# Patient Record
Sex: Female | Born: 1995 | Race: Asian | Hispanic: No | Marital: Single | Smoking: Never smoker
Health system: Southern US, Community
[De-identification: ages and names within clinical notes are randomized; demographics above are authoritative.]

## PROBLEM LIST (undated history)

## (undated) DIAGNOSIS — L709 Acne, unspecified: Secondary | ICD-10-CM

## (undated) HISTORY — PX: WISDOM TOOTH EXTRACTION: SHX21

## (undated) HISTORY — DX: Acne, unspecified: L70.9

---
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
## 2016-02-05 ENCOUNTER — Telehealth: Payer: Self-pay | Admitting: Family Medicine

## 2016-02-05 NOTE — Telephone Encounter (Signed)
We received faxed from [facility] regarding pt having back pain. Called pt on Monday to see in appt was needed, father answered stating that [facility] and in now back in [place] for school.

## 2016-02-29 ENCOUNTER — Other Ambulatory Visit: Payer: Self-pay | Admitting: Family Medicine

## 2016-04-04 ENCOUNTER — Encounter: Admitting: Family Medicine

## 2016-04-23 ENCOUNTER — Ambulatory Visit (INDEPENDENT_AMBULATORY_CARE_PROVIDER_SITE_OTHER): Payer: No Typology Code available for payment source | Admitting: Family Medicine

## 2016-04-23 ENCOUNTER — Encounter: Payer: Self-pay | Admitting: Family Medicine

## 2016-04-23 DIAGNOSIS — Z0000 Encounter for general adult medical examination without abnormal findings: Secondary | ICD-10-CM | POA: Diagnosis not present

## 2016-04-23 NOTE — Assessment & Plan Note (Signed)
Pt's PE WNL.  UTD on immunizations.  Too young for pap.  Anticipatory guidance provided.

## 2016-04-23 NOTE — Patient Instructions (Signed)
Follow up in 1 year or as needed Keep up the good work on healthy diet and regular exercise- you look great!!! I'm so proud of your hard work at school- keep it up!!! Call with any questions or concerns Happy Holidays!!!

## 2016-04-23 NOTE — Progress Notes (Signed)
   Subjective:    Patient ID: Carolyn Cummings, female    DOB: [birth year], 20 y.o.   MRN: [id number]  HPI CPE- UTD on all immunizations.  Too young for pap.  [place] [employer] studying exercise science.  No concerns today.  Not currently sexually active.  Currently has a boyfriend but he is in [place] (in [place]  Denies depression/anxiety.  Not currently drinking, smoking, drugs.   Review of Systems Patient reports no vision/ hearing changes, adenopathy,fever, weight change,  persistant/recurrent hoarseness , swallowing issues, chest pain, palpitations, edema, persistant/recurrent cough, hemoptysis, dyspnea (rest/exertional/paroxysmal nocturnal), gastrointestinal bleeding (melena, rectal bleeding), abdominal pain, significant heartburn, bowel changes, GU symptoms (dysuria, hematuria, incontinence), Gyn symptoms (abnormal  bleeding, pain),  syncope, focal weakness, memory loss, numbness & tingling, skin/hair/nail changes, abnormal bruising or bleeding, anxiety, or depression.     Objective:   Physical Exam  General Appearance:    Alert, cooperative, no distress, appears stated age  Head:    Normocephalic, without obvious abnormality, atraumatic  Eyes:    PERRL, conjunctiva/corneas clear, EOM's intact, fundi    benign, both eyes  Ears:    Normal TM's and external ear canals, both ears  Nose:   Nares normal, septum midline, mucosa normal, no drainage    or sinus tenderness  Throat:   Lips, mucosa, and tongue normal; teeth and gums normal  Neck:   Supple, symmetrical, trachea midline, no adenopathy;    Thyroid: no enlargement/tenderness/nodules  Back:     Symmetric, no curvature, ROM normal, no CVA tenderness  Lungs:     Clear to auscultation bilaterally, respirations unlabored  Chest Wall:    No tenderness or deformity   Heart:    Regular rate and rhythm, S1 and S2 normal, no murmur, rub   or gallop  Breast Exam:    No tenderness, masses, or nipple abnormality  Abdomen:     Soft,  non-tender, bowel sounds active all four quadrants,    no masses, no organomegaly  Genitalia:    deferred  Rectal:    Extremities:   Extremities normal, atraumatic, no cyanosis or edema  Pulses:   2+ and symmetric all extremities  Skin:   Skin color, texture, turgor normal, no rashes or lesions  Lymph nodes:   Cervical, supraclavicular, and axillary nodes normal  Neurologic:   CNII-XII intact, normal strength, sensation and reflexes    throughout          Assessment & Plan:

## 2016-04-23 NOTE — Progress Notes (Signed)
Pre visit review using our clinic review tool, if applicable. No additional management support is needed unless otherwise documented below in the visit note. 

## 2016-05-07 ENCOUNTER — Encounter: Payer: Self-pay | Admitting: Family Medicine

## 2016-05-08 MED ORDER — NORGESTIMATE-ETH ESTRADIOL 0.25-35 MG-MCG PO TABS: 1.0000 | ORAL_TABLET | Freq: Every day | ORAL | 1 refills | Status: DC

## 2016-09-14 ENCOUNTER — Other Ambulatory Visit: Payer: Self-pay | Admitting: Family Medicine

## 2016-10-11 ENCOUNTER — Other Ambulatory Visit (HOSPITAL_COMMUNITY)
Admission: RE | Admit: 2016-10-11 | Discharge: 2016-10-11 | Disposition: A | Discharge status: Home / Self Care | Source: Ambulatory Visit | Attending: Family Medicine | Admitting: Family Medicine

## 2016-10-11 ENCOUNTER — Ambulatory Visit (INDEPENDENT_AMBULATORY_CARE_PROVIDER_SITE_OTHER): Admitting: Family Medicine

## 2016-10-11 ENCOUNTER — Encounter: Payer: Self-pay | Admitting: Family Medicine

## 2016-10-11 VITALS — BP 102/68 | HR 76 | Temp 98.0°F | Resp 16 | Ht 60.0 in | Wt 119.4 lb

## 2016-10-11 DIAGNOSIS — Z202 Contact with and (suspected) exposure to infections with a predominantly sexual mode of transmission: Secondary | ICD-10-CM | POA: Insufficient documentation

## 2016-10-11 NOTE — Progress Notes (Signed)
   Subjective:    Patient ID: Carolyn NovasJilien Cummings, female    DOB: [birth year], 21 y.o.   MRN: [id number]  HPI STD testing- currently sexually active w/ 1 partner but has been active w/ others in the past.  Using condoms each time.  No sxs- no vaginal d/c, pain, sores, lesions.   Review of Systems For ROS see HPI     Objective:   Physical Exam  Constitutional: She is oriented to person, place, and time. She appears well-developed and well-nourished. No distress.  HENT:  Head: Normocephalic and atraumatic.  Genitourinary:  Genitourinary Comments: Deferred at pt's request  Neurological: She is alert and oriented to person, place, and time.  Skin: Skin is warm and dry.  Psychiatric: She has a normal mood and affect. Her behavior is normal. Thought content normal.  Vitals reviewed.         Assessment & Plan:  Possible STD exposure- new.  Pt reports she has been sexually active w/ multiple partners in the past.  Reports she is using condoms regularly but 'wants to be sure'.  Will check labs to determine if any tx is needed.  Pt expressed understanding and is in agreement w/ plan.

## 2016-10-11 NOTE — Progress Notes (Signed)
Pre visit review using our clinic review tool, if applicable. No additional management support is needed unless otherwise documented below in the visit note. 

## 2016-10-11 NOTE — Patient Instructions (Signed)
Follow up as needed/scheduled We'll notify you of your lab results and make any changes if needed Continue to protect yourself every time! I'm proud of you for being responsible! Have a great summer!!

## 2016-10-12 LAB — HIV ANTIBODY (ROUTINE TESTING W REFLEX): HIV 1&2 Ab, 4th Generation: NONREACTIVE

## 2016-10-12 LAB — RPR

## 2016-10-14 LAB — URINE CYTOLOGY ANCILLARY ONLY
Chlamydia: NEGATIVE
Neisseria Gonorrhea: NEGATIVE
Trichomonas: NEGATIVE

## 2016-10-15 LAB — HSV(HERPES SMPLX)ABS-I+II(IGG+IGM)-BLD
HSV 1 Glycoprotein G Ab, IgG: 53.7 index — ABNORMAL HIGH (ref 0.00–0.90)
HSV 2 Glycoprotein G Ab, IgG: <0.91 index (ref 0.00–0.90)
HSVI/II Comb IgM: <0.91 Ratio (ref 0.00–0.90)

## 2017-02-10 ENCOUNTER — Ambulatory Visit (INDEPENDENT_AMBULATORY_CARE_PROVIDER_SITE_OTHER): Admitting: Physician Assistant

## 2017-02-10 ENCOUNTER — Encounter: Payer: Self-pay | Admitting: Physician Assistant

## 2017-02-10 VITALS — BP 120/80 | HR 75 | Temp 98.8°F | Ht 60.0 in | Wt 114.0 lb

## 2017-02-10 DIAGNOSIS — N3090 Cystitis, unspecified without hematuria: Secondary | ICD-10-CM | POA: Diagnosis not present

## 2017-02-10 LAB — POCT URINALYSIS DIPSTICK
Bilirubin, UA: NEGATIVE
Blood, UA: 3
Glucose, UA: NEGATIVE
Ketones, UA: 1
Nitrite, UA: NEGATIVE
Protein, UA: 2
Spec Grav, UA: >=1.03 — AB (ref 1.010–1.025)
Urobilinogen, UA: 0.2 E.U./dL
pH, UA: 6 (ref 5.0–8.0)

## 2017-02-10 MED ORDER — NITROFURANTOIN MONOHYD MACRO 100 MG PO CAPS: 100.0000 mg | ORAL_CAPSULE | Freq: Two times a day (BID) | ORAL | 0 refills | Status: DC

## 2017-02-10 NOTE — Progress Notes (Signed)
Carolyn Cummings is a 21 y.o. female here for a new problem.  I acted as a Neurosurgeon for [employer], PA-C Corky Mull, LPN  History of Present Illness:   Chief Complaint  Patient presents with  . Dysuria  . Urinary Frequency    Dysuria   Episode onset: started 2 days ago. The problem occurs every urination. The problem has been gradually worsening. The quality of the pain is described as burning. The pain is at a severity of 8/10. The pain is moderate. She is sexually active. There is no history of pyelonephritis. She has tried increased fluids (Monistat) for the symptoms. The treatment provided no relief.   She declines STI testing, as she just had this performed in June and was negative. She denies: back pain, fever, n/v, prior hx of UTI/pyelonephritis.     Past Medical History:  Diagnosis Date  . Acne      Social History   Social History  . Marital status: Single    Spouse name: N/A  . Number of children: N/A  . Years of education: N/A   Occupational History  . Not on file.   Social History Main Topics  . Smoking status: Never Smoker  . Smokeless tobacco: Never Used  . Alcohol use No  . Drug use: No  . Sexual activity: No   Other Topics Concern  . Not on file   Social History Narrative  . No narrative on file    Past Surgical History:  Procedure Laterality Date  . WISDOM TOOTH EXTRACTION      Family History  Problem Relation Age of Onset  . Adopted: Yes    No Known Allergies  Current Medications:   Current Outpatient Prescriptions:  .  EPIDUO FORTE 0.3-2.5 % GEL, , Disp: , Rfl: 0 .  MONONESSA 0.25-35 MG-MCG tablet, TAKE 1 TABLET DAILY, Disp: 84 tablet, Rfl: 1 .  nitrofurantoin, macrocrystal-monohydrate, (MACROBID) 100 MG capsule, Take 1 capsule (100 mg total) by mouth 2 (two) times daily., Disp: 10 capsule, Rfl: 0   Review of Systems:   Review of Systems  Genitourinary: Positive for dysuria.  All other systems reviewed and are  negative.   Vitals:   Vitals:   02/10/17 1012  BP: 120/80  Pulse: 75  Temp: 98.8 F (37.1 C)  TempSrc: Oral  SpO2: 99%  Weight: 114 lb (51.7 kg)  Height: 5' (1.524 m)     Body mass index is 22.26 kg/m.  Physical Exam:   Physical Exam  Constitutional: She appears well-developed. She is cooperative.  Non-toxic appearance. She does not have a sickly appearance. She does not appear ill. No distress.  Cardiovascular: Normal rate, regular rhythm, S1 normal, S2 normal, normal heart sounds and normal pulses.   No LE edema  Pulmonary/Chest: Effort normal and breath sounds normal.  Abdominal: Normal appearance and bowel sounds are normal. There is no tenderness. There is no rigidity, no rebound, no guarding and no CVA tenderness.  Neurological: She is alert. GCS eye subscore is 4. GCS verbal subscore is 5. GCS motor subscore is 6.  Skin: Skin is warm, dry and intact.  Psychiatric: She has a normal mood and affect. Her speech is normal and behavior is normal.  Nursing note and vitals reviewed.  Results for orders placed or performed in visit on 02/10/17  POCT urinalysis dipstick  Result Value Ref Range   Color, UA amber    Clarity, UA cloudy    Glucose, UA Negative    Bilirubin,  UA Negative    Ketones, UA 1    Spec Grav, UA >=1.030 (A) 1.010 - 1.025   Blood, UA 3    pH, UA 6.0 5.0 - 8.0   Protein, UA 2    Urobilinogen, UA 0.2 0.2 or 1.0 E.U./dL   Nitrite, UA Negative    Leukocytes, UA Moderate (2+) (A) Negative    Assessment and Plan:    Carolyn Cummings was seen today for dysuria and urinary frequency.  Diagnoses and all orders for this visit:  Cystitis Uncomplicated. No systemic symptoms present. Treat with macrobid per orders. Discussed need for adequate water intake, push fluids until urine is clear and maintain this. We discussed use of AZO, and if she decides to take it to only take for a short duration, <2 days. I did order a urine culture. Follow-up if symptoms worsen or  persist. -     POCT urinalysis dipstick -     Urine Culture  Other orders -     nitrofurantoin, macrocrystal-monohydrate, (MACROBID) 100 MG capsule; Take 1 capsule (100 mg total) by mouth 2 (two) times daily.    . Reviewed expectations re: course of current medical issues. . Discussed self-management of symptoms. . Outlined signs and symptoms indicating need for more acute intervention. . Patient verbalized understanding and all questions were answered. . See orders for this visit as documented in the electronic medical record. . Patient received an After-Visit Summary.  CMA or LPN served as scribe during this visit. History, Physical, and Plan performed by medical provider. Documentation and orders reviewed and attested to.  Jarold Motto, PA-C

## 2017-02-10 NOTE — Patient Instructions (Signed)
It was great to meet you!   Start the antibiotic. Take with plenty of fluid. Maintain adequate fluid intake to keep urine mostly clear.  If you decide to take AZO, do not take for more than 3 days.

## 2017-02-13 LAB — URINE CULTURE
MICRO NUMBER:: 81117456
SPECIMEN QUALITY:: ADEQUATE

## 2017-03-03 ENCOUNTER — Other Ambulatory Visit: Payer: Self-pay | Admitting: Family Medicine

## 2017-04-24 ENCOUNTER — Encounter: Admitting: Family Medicine

## 2017-05-07 ENCOUNTER — Encounter: Payer: Self-pay | Admitting: Family Medicine

## 2017-05-07 ENCOUNTER — Ambulatory Visit (INDEPENDENT_AMBULATORY_CARE_PROVIDER_SITE_OTHER): Admitting: Family Medicine

## 2017-05-07 VITALS — BP 120/68 | HR 80 | Temp 98.9°F | Ht 59.5 in | Wt 110.4 lb

## 2017-05-07 DIAGNOSIS — Z3041 Encounter for surveillance of contraceptive pills: Secondary | ICD-10-CM | POA: Insufficient documentation

## 2017-05-07 DIAGNOSIS — L700 Acne vulgaris: Secondary | ICD-10-CM | POA: Insufficient documentation

## 2017-05-07 DIAGNOSIS — Z0000 Encounter for general adult medical examination without abnormal findings: Secondary | ICD-10-CM | POA: Diagnosis not present

## 2017-05-07 LAB — COMPREHENSIVE METABOLIC PANEL
ALT: 23 U/L (ref 0–35)
AST: 22 U/L (ref 0–37)
Albumin: 4.2 g/dL (ref 3.5–5.2)
Alkaline Phosphatase: 68 U/L (ref 39–117)
BUN: 12 mg/dL (ref 6–23)
CO2: 28 mEq/L (ref 19–32)
Calcium: 9.1 mg/dL (ref 8.4–10.5)
Chloride: 102 mEq/L (ref 96–112)
Creatinine, Ser: 0.88 mg/dL (ref 0.40–1.20)
GFR: 86.89 mL/min (ref >60.00)
Glucose, Bld: 150 mg/dL — ABNORMAL HIGH (ref 70–99)
Potassium: 3.7 mEq/L (ref 3.5–5.1)
Sodium: 139 mEq/L (ref 135–145)
Total Bilirubin: 0.5 mg/dL (ref 0.2–1.2)
Total Protein: 6.6 g/dL (ref 6.0–8.3)

## 2017-05-07 LAB — CBC WITH DIFFERENTIAL/PLATELET
Basophils Absolute: 0 10*3/uL (ref 0.0–0.1)
Basophils Relative: 0.7 % (ref 0.0–3.0)
Eosinophils Absolute: 0.1 10*3/uL (ref 0.0–0.7)
Eosinophils Relative: 1.5 % (ref 0.0–5.0)
HCT: 42.5 % (ref 36.0–46.0)
Hemoglobin: 13.7 g/dL (ref 12.0–15.0)
Lymphocytes Relative: 39.5 % (ref 12.0–46.0)
Lymphs Abs: 2.1 10*3/uL (ref 0.7–4.0)
MCHC: 32.3 g/dL (ref 30.0–36.0)
MCV: 87.8 fl (ref 78.0–100.0)
Monocytes Absolute: 0.4 10*3/uL (ref 0.1–1.0)
Monocytes Relative: 7.1 % (ref 3.0–12.0)
Neutro Abs: 2.8 10*3/uL (ref 1.4–7.7)
Neutrophils Relative %: 51.2 % (ref 43.0–77.0)
Platelets: 276 10*3/uL (ref 150.0–400.0)
RBC: 4.84 Mil/uL (ref 3.87–5.11)
RDW: 13.3 % (ref 11.5–14.6)
WBC: 5.4 10*3/uL (ref 4.5–10.5)

## 2017-05-07 LAB — LIPID PANEL
Cholesterol: 167 mg/dL (ref 0–200)
HDL: 71.2 mg/dL (ref >39.00)
LDL Cholesterol: 58 mg/dL (ref 0–99)
NonHDL: 96.05
Total CHOL/HDL Ratio: 2
Triglycerides: 188 mg/dL — ABNORMAL HIGH (ref 0.0–149.0)
VLDL: 37.6 mg/dL (ref 0.0–40.0)

## 2017-05-07 NOTE — Patient Instructions (Signed)
Please return in 1 year for your physical.   Please do these things to maintain good health!   Exercise at least 30-45 minutes a day,  4-5 days a week.   Eat a low-fat diet with lots of fruits and vegetables, up to 7-9 servings per day.  Drink plenty of water daily. Try to drink 8 8oz glasses per day.  Seatbelts can save your life. Always wear your seatbelt.  Place Smoke Detectors on every level of your home and check batteries every year.  Schedule an appointment with an eye doctor for an eye exam every 1-2 years  Safe sex - use condoms to protect yourself from STDs if you could be exposed to these types of infections. Use birth control if you do not want to become pregnant and are sexually active.  Avoid heavy alcohol use. If you drink, keep it to less than 2 drinks/day and not every day.  Health Care Power of Attorney.  Choose someone you trust that could speak for you if you became unable to speak for yourself.  Depression is common in our stressful world.If you're feeling down or losing interest in things you normally enjoy, please come in for a visit.  If anyone is threatening or hurting you, please get help. Physical or Emotional Violence is never OK.

## 2017-05-07 NOTE — Progress Notes (Signed)
Subjective  Chief Complaint  Patient presents with  . Annual Exam    Not fasting    HPI: Carolyn Cummings is a 22 y.o. female who presents to [place] at [facility] today for a Female Wellness Visit.   Wellness Visit: annual visit with health maintenance review and exam without Pap   Healthy 22 yo G0 for annual exam. 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 Doing well. Not currently in a relationship nor sexually active now; has been in past; on OCPs w/o AEs. Last STD testing 6 months ago and negative. No h/o STDs. No concerns.   Healthy lifestyle Lifestyle: Body mass index is 21.92 kg/m. Wt Readings from Last 3 Encounters:  05/07/17 110 lb 6.1 oz (50.1 kg)  02/10/17 114 lb (51.7 kg) (22 %, Z= -0.78)*  10/11/16 119 lb 6 oz (54.1 kg) (33 %, Z= -0.44)*   * Growth percentiles are based on CDC (Girls, 2-20 Years) data.   Diet: low fat Exercise: daily, aerobics, running/ jogging and weightlifting Need for contraception: Yes, OCP (estrogen/progesterone)  Patient Active Problem List   Diagnosis Date Noted  . Acne vulgaris 05/07/2017  . Oral contraceptive use 05/07/2017  . Physical exam 04/23/2016   Health Maintenance  Topic Date Due  . INFLUENZA VACCINE  02/10/2018 (Originally 12/04/2016)  . TETANUS/TDAP  06/02/2018  . HIV Screening  Completed   Immunization History  Administered Date(s) Administered  . DTaP 12/22/1997, 02/02/1998, 03/09/1998, 09/04/1998, 07/30/2002  . H1N1 06/02/2008  . HPV Quadrivalent 10/17/2008, 02/17/2009, 02/28/2010  . Hepatitis A 08/13/2005, 03/11/2007  . Hepatitis B 02/02/1998, 03/23/1998, 09/04/1998  . HiB (PRP-OMP) 12/22/1997, 03/09/1998, 06/22/1998  . IPV 12/22/1997, 02/02/1998, 09/04/1998, 07/30/2002  . Influenza Nasal 03/11/2007, 06/02/2008, 02/28/2010  . Influenza Split 03/19/2011, 06/08/2013  . Influenza,inj,Quad PF,6+ Mos 12/23/2015  . MMR 03/09/1998, 07/30/2002  . Meningococcal Conjugate 06/02/2008, 04/04/2015    . Pneumococcal Conjugate-13 12/22/1997, 09/04/1998  . Tdap 06/02/2008  . Varicella 03/09/1998, 10/16/1998   We updated and reviewed the patient's past history in detail and it is documented below. Allergies: Patient has No Known Allergies. Past Medical History Patient  has a past medical history of Acne. Past Surgical History Patient  has a past surgical history that includes Wisdom tooth extraction. Family History: Patient family history is not on file. She was adopted. Social History:  Patient  reports that  has never smoked. she has never used smokeless tobacco. She reports that she does not drink alcohol or use drugs.  Review of Systems: Constitutional: negative for fever or malaise Ophthalmic: negative for photophobia, double vision or loss of vision Cardiovascular: negative for chest pain, dyspnea on exertion, or new LE swelling Respiratory: negative for SOB or persistent cough Gastrointestinal: negative for abdominal pain, change in bowel habits or melena Genitourinary: negative for dysuria or gross hematuria, no abnormal uterine bleeding or disharge Musculoskeletal: negative for new gait disturbance or muscular weakness Integumentary: negative for new or persistent rashes, no breast lumps Neurological: negative for TIA or stroke symptoms Psychiatric: negative for SI or delusions Allergic/Immunologic: negative for hives Patient Care Team    Relationship Specialty Notifications Start End  Midge Minium, MD PCP - General Family Medicine  04/04/15     Objective  Vitals: BP 120/68 (BP Location: Left Arm, Patient Position: Sitting, Cuff Size: Normal)   Pulse 80   Temp 98.9 F (37.2 C) (Oral)   Ht 4' 11.5" (1.511 m)   Wt 110 lb 6.1 oz (50.1 kg)   LMP 04/21/2017  SpO2 98%   BMI 21.92 kg/m  General:  Well developed, well nourished, no acute distress  Psych:  Alert and orientedx3,normal mood and affect HEENT:  Normocephalic, atraumatic, non-icteric sclera, PERRL,  oropharynx is clear without mass or exudate, supple neck without adenopathy, mass or thyromegaly Cardiovascular:  Normal S1, S2, RRR without gallop, rub or murmur, nondisplaced PMI Respiratory:  Good breath sounds bilaterally, CTAB with normal respiratory effort Gastrointestinal: normal bowel sounds, soft, non-tender, no noted masses. No HSM MSK: no deformities, contusions. Joints are without erythema or swelling. Spine and CVA region are nontender Skin:  Warm, no rashes or suspicious lesions noted Neurologic:    Mental status is normal. CN 2-11 are normal. Gross motor and sensory exams are normal. Normal gait. No tremor   Assessment  1. Physical exam   2. Oral contraceptive use      Plan  Female Wellness Visit:  Age appropriate Health Maintenance and Prevention measures were discussed with patient. Included topics are cancer screening recommendations, ways to keep healthy (see AVS) including dietary and exercise recommendations, regular eye and dental care, use of seat belts, and avoidance of moderate alcohol use and tobacco use.   BMI: discussed patient's BMI and encouraged positive lifestyle modifications to help get to or maintain a target BMI.  HM needs and immunizations were addressed and ordered. See below for orders. See HM and immunization section for updates. Pt declines flu shot; education given.  Routine labs and screening tests ordered including cmp, cbc and lipids where appropriate.  Discussed recommendations regarding Vit D and calcium supplementation (see AVS)  Continue ocps  Follow up: Return in about 12 months (around 05/07/2018) for your complete physical.    Commons side effects, risks, benefits, and alternatives for medications and treatment plan prescribed today were discussed, and the patient expressed understanding of the given instructions. Patient is instructed to call or message via MyChart if he/she has any questions or concerns regarding our treatment plan.  No barriers to understanding were identified. We discussed Red Flag symptoms and signs in detail. Patient expressed understanding regarding what to do in case of urgent or emergency type symptoms.   Medication list was reconciled, printed and provided to the patient in AVS. Patient instructions and summary information was reviewed with the patient as documented in the AVS. This note was prepared with assistance of Dragon voice recognition software. Occasional wrong-word or sound-a-like substitutions may have occurred due to the inherent limitations of voice recognition software  Orders Placed This Encounter  Procedures  . CBC with Differential/Platelet  . Comprehensive metabolic panel  . Lipid panel   No orders of the defined types were placed in this encounter.

## 2017-08-18 ENCOUNTER — Other Ambulatory Visit: Payer: Self-pay | Admitting: Family Medicine

## 2017-10-21 ENCOUNTER — Ambulatory Visit (INDEPENDENT_AMBULATORY_CARE_PROVIDER_SITE_OTHER): Admitting: Family Medicine

## 2017-10-21 ENCOUNTER — Other Ambulatory Visit: Payer: Self-pay

## 2017-10-21 ENCOUNTER — Encounter: Payer: Self-pay | Admitting: Family Medicine

## 2017-10-21 VITALS — BP 124/76 | HR 92 | Temp 98.5°F | Resp 16 | Ht 59.5 in | Wt 115.0 lb

## 2017-10-21 DIAGNOSIS — M545 Low back pain, unspecified: Secondary | ICD-10-CM

## 2017-10-21 DIAGNOSIS — H1011 Acute atopic conjunctivitis, right eye: Secondary | ICD-10-CM

## 2017-10-21 MED ORDER — DICLOFENAC SODIUM 75 MG PO TBEC: 75.0000 mg | DELAYED_RELEASE_TABLET | Freq: Two times a day (BID) | ORAL | 0 refills | Status: DC

## 2017-10-21 NOTE — Patient Instructions (Signed)
Please follow up if symptoms do not improve or as needed.  Take the antiinflammatory pain medications twice a day with food for the next 1-2 weeks.   Return for reevaluation in 2-3 weeks if your back pain is not better.  If your eye becomes itchy or red again, use OTC Zadator eye drops.    Back Exercises The following exercises strengthen the muscles that help to support the back. They also help to keep the lower back flexible. Doing these exercises can help to prevent back pain or lessen existing pain. If you have back pain or discomfort, try doing these exercises 2-3 times each day or as told by your health care provider. When the pain goes away, do them once each day, but increase the number of times that you repeat the steps for each exercise (do more repetitions). If you do not have back pain or discomfort, do these exercises once each day or as told by your health care provider. Exercises Single Knee to Chest  Repeat these steps 3-5 times for each leg: 1. Lie on your back on a firm bed or the floor with your legs extended. 2. Bring one knee to your chest. Your other leg should stay extended and in contact with the floor. 3. Hold your knee in place by grabbing your knee or thigh. 4. Pull on your knee until you feel a gentle stretch in your lower back. 5. Hold the stretch for 10-30 seconds. 6. Slowly release and straighten your leg.  Pelvic Tilt  Repeat these steps 5-10 times: 1. Lie on your back on a firm bed or the floor with your legs extended. 2. Bend your knees so they are pointing toward the ceiling and your feet are flat on the floor. 3. Tighten your lower abdominal muscles to press your lower back against the floor. This motion will tilt your pelvis so your tailbone points up toward the ceiling instead of pointing to your feet or the floor. 4. With gentle tension and even breathing, hold this position for 5-10 seconds.  Cat-Cow  Repeat these steps until your lower back  becomes more flexible: 1. Get into a hands-and-knees position on a firm surface. Keep your hands under your shoulders, and keep your knees under your hips. You may place padding under your knees for comfort. 2. Let your head hang down, and point your tailbone toward the floor so your lower back becomes rounded like the back of a cat. 3. Hold this position for 5 seconds. 4. Slowly lift your head and point your tailbone up toward the ceiling so your back forms a sagging arch like the back of a cow. 5. Hold this position for 5 seconds.  Press-Ups  Repeat these steps 5-10 times: 1. Lie on your abdomen (face-down) on the floor. 2. Place your palms near your head, about shoulder-width apart. 3. While you keep your back as relaxed as possible and keep your hips on the floor, slowly straighten your arms to raise the top half of your body and lift your shoulders. Do not use your back muscles to raise your upper torso. You may adjust the placement of your hands to make yourself more comfortable. 4. Hold this position for 5 seconds while you keep your back relaxed. 5. Slowly return to lying flat on the floor.  Bridges  Repeat these steps 10 times: 1. Lie on your back on a firm surface. 2. Bend your knees so they are pointing toward the ceiling and your feet  are flat on the floor. 3. Tighten your buttocks muscles and lift your buttocks off of the floor until your waist is at almost the same height as your knees. You should feel the muscles working in your buttocks and the back of your thighs. If you do not feel these muscles, slide your feet 1-2 inches farther away from your buttocks. 4. Hold this position for 3-5 seconds. 5. Slowly lower your hips to the starting position, and allow your buttocks muscles to relax completely.  If this exercise is too easy, try doing it with your arms crossed over your chest. Abdominal Crunches  Repeat these steps 5-10 times: 1. Lie on your back on a firm bed or the  floor with your legs extended. 2. Bend your knees so they are pointing toward the ceiling and your feet are flat on the floor. 3. Cross your arms over your chest. 4. Tip your chin slightly toward your chest without bending your neck. 5. Tighten your abdominal muscles and slowly raise your trunk (torso) high enough to lift your shoulder blades a tiny bit off of the floor. Avoid raising your torso higher than that, because it can put too much stress on your low back and it does not help to strengthen your abdominal muscles. 6. Slowly return to your starting position.  Back Lifts Repeat these steps 5-10 times: 1. Lie on your abdomen (face-down) with your arms at your sides, and rest your forehead on the floor. 2. Tighten the muscles in your legs and your buttocks. 3. Slowly lift your chest off of the floor while you keep your hips pressed to the floor. Keep the back of your head in line with the curve in your back. Your eyes should be looking at the floor. 4. Hold this position for 3-5 seconds. 5. Slowly return to your starting position.  Contact a health care provider if:  Your back pain or discomfort gets much worse when you do an exercise.  Your back pain or discomfort does not lessen within 2 hours after you exercise. If you have any of these problems, stop doing these exercises right away. Do not do them again unless your health care provider says that you can. Get help right away if:  You develop sudden, severe back pain. If this happens, stop doing the exercises right away. Do not do them again unless your health care provider says that you can. This information is not intended to replace advice given to you by your health care provider. Make sure you discuss any questions you have with your health care provider. Document Released: 05/30/2004 Document Revised: 08/30/2015 Document Reviewed: 06/16/2014 Elsevier Interactive Patient Education  2017 [employer].

## 2017-10-21 NOTE — Progress Notes (Signed)
Subjective  CC:  Chief Complaint  Patient presents with  . Eye Pain    Right eye pain, both of her parents have pink eye currently  . Back Pain    lower back, works out at the gym    HPI: Carolyn Cummings is a 22 y.o. female who presents to the office today to address the problems listed above in the chief complaint.  Parents have been cleaning out an older home and have red itching eyes. Pt had red itchy right eye last night; used expired sulfa eye drop once last night. Today, eye is back to normal. No drainage, photophobia, redness or vision problems. No URI sxs.   Low back pain: strength training with heavy weight: back squats 230# - has pain x 4 weeks. Improved with ibuprofen - took for 2 days. No radicular pain or leg sxs. No injury. No limitation of mvt. Pain is worse if slouching; better if sits erect. No abdominal pain.    Assessment  1. Allergic conjunctivitis of right eye   2. Acute midline low back pain without sciatica      Plan   conjunctivitis:  Most c/w allergic - now improved. Treat with antihistamine drops if returns. See avs  Lbp: strain from load: rest, back exercises, nsaids, stretching/rolling. Return if not improving for imaging and reevaluation.   Follow up: prn   No orders of the defined types were placed in this encounter.  Meds ordered this encounter  Medications  . Carolyn (VOLTAREN) 75 MG EC tablet    Sig: Take 1 tablet (75 mg total) by mouth 2 (two) times daily.    Dispense:  30 tablet    Refill:  0      I reviewed the patients updated PMH, FH, and SocHx.    Patient Active Problem List   Diagnosis Date Noted  . Acne vulgaris 05/07/2017  . Oral contraceptive use 05/07/2017  . Physical exam 04/23/2016   Current Meds  Medication Sig  . Carolyn Cummings 0.3-2.5 % GEL   . Carolyn Cummings 0.25-35 MG-MCG tablet TAKE 1 TABLET DAILY    Allergies: Patient has No Known Allergies. Family History: Patient family history is not on file. She was  adopted. Social History:  Patient  reports that she has never smoked. She has never used smokeless tobacco. She reports that she does not drink alcohol or use drugs.  Review of Systems: Constitutional: Negative for fever malaise or anorexia Cardiovascular: negative for chest pain Respiratory: negative for SOB or persistent cough Gastrointestinal: negative for abdominal pain  Objective  Vitals: BP 124/76   Pulse 92   Temp 98.5 F (36.9 C) (Oral)   Resp 16   Ht 4' 11.5" (1.511 m)   Wt 115 lb (52.2 kg)   SpO2 99%   BMI 22.84 kg/m  General: no acute distress , A&Ox3 HEENT: PEERL, conjunctiva normal bilaterally w/o photophobia,  Back: FROM, nl gait, mild midline and bilateral SI joint ttp, neg slr bilaterally, increased quad/hamstring bulk, nl tone. Skin:  Warm, no rashes     Commons side effects, risks, benefits, and alternatives for medications and treatment plan prescribed today were discussed, and the patient expressed understanding of the given instructions. Patient is instructed to call or message via MyChart if he/she has any questions or concerns regarding our treatment plan. No barriers to understanding were identified. We discussed Red Flag symptoms and signs in detail. Patient expressed understanding regarding what to do in case of urgent or emergency type symptoms.  Medication list was reconciled, printed and provided to the patient in AVS. Patient instructions and summary information was reviewed with the patient as documented in the AVS. This note was prepared with assistance of Dragon voice recognition software. Occasional wrong-word or sound-a-like substitutions may have occurred due to the inherent limitations of voice recognition software

## 2018-02-02 ENCOUNTER — Other Ambulatory Visit: Payer: Self-pay | Admitting: Family Medicine

## 2018-02-02 MED ORDER — NORGESTIMATE-ETH ESTRADIOL 0.25-35 MG-MCG PO TABS: 1.0000 | ORAL_TABLET | Freq: Every day | ORAL | 1 refills | Status: DC

## 2018-03-04 ENCOUNTER — Telehealth: Payer: Self-pay | Admitting: Family Medicine

## 2018-03-04 NOTE — Telephone Encounter (Signed)
Can we put her in a same day slot for this for for this issus or should she be a appt?   Copied from CRM [id number]. Topic: Appointment Scheduling - Scheduling Inquiry for Clinic >> Mar 03, 2018  4:49 PM Terisa Starr wrote: Reason for CRM: Patient is requesting to come in on a Friday only due to living in [place] to see Dr Beverely Low for anxiety and talk about possibly getting on medication. Please contact pt.

## 2018-03-04 NOTE — Telephone Encounter (Signed)
Ok to schedule for Friday- if no 30 minute slots, please use an 11:30

## 2018-03-04 NOTE — Telephone Encounter (Signed)
Pt has been scheduled for an appt

## 2018-03-07 ENCOUNTER — Encounter: Payer: Self-pay | Admitting: Family Medicine

## 2018-03-27 ENCOUNTER — Ambulatory Visit (INDEPENDENT_AMBULATORY_CARE_PROVIDER_SITE_OTHER): Admitting: Family Medicine

## 2018-03-27 ENCOUNTER — Encounter: Payer: Self-pay | Admitting: Family Medicine

## 2018-03-27 ENCOUNTER — Other Ambulatory Visit: Payer: Self-pay

## 2018-03-27 DIAGNOSIS — F419 Anxiety disorder, unspecified: Secondary | ICD-10-CM

## 2018-03-27 MED ORDER — BUSPIRONE HCL 15 MG PO TABS: 15.0000 mg | ORAL_TABLET | Freq: Two times a day (BID) | ORAL | 3 refills | Status: DC

## 2018-03-27 NOTE — Assessment & Plan Note (Signed)
New to provider, ongoing for pt.  Sxs have worsened recently w/ graduation looming.  There is some friction between pt and parents regarding her plan for after school.  Had discussion w/ pt that while respecting her parents and their wishes are important she is also transitioning to adulthood and needs to live her life.  Her plan for after school sounds completely reasonable and includes grad school in the future.  Pt felt validated by my understanding.  Also discussed that as long as she understands that pets require work and time and she is entering that decision w/ reasonable expectations, it is her choice to make.  She seemed excited by this as well.  Start low dose Buspar twice daily and monitor closely for improvement.  Total time spent w/ pt, 30 minutes, >50% spent counseling.

## 2018-03-27 NOTE — Progress Notes (Signed)
   Subjective:    Patient ID: Carolyn Cummings, female    DOB: June 19, 1995, 22 y.o.   MRN: [id number]  HPI Anxiety- 'my mom said I should' come in.  Pt reports her anxiety is worsening.  'i've always had it, but now it's every day'.  Denies anxiety attacks but feels a baseline level of daily anxiety.  Pt is in senior year of college- school is going well.  Pt is currently in a relationship- feels safe.  Pt reports parents are stressing her and the idea of what to do after college is stressful.  Pt is interested in having a dog but parents are not supportive of this.   Review of Systems For ROS see HPI     Objective:   Physical Exam  Constitutional: She is oriented to person, place, and time. She appears well-developed and well-nourished. No distress.  HENT:  Head: Normocephalic and atraumatic.  Neurological: She is alert and oriented to person, place, and time. No cranial nerve deficit. Coordination normal.  Skin: Skin is warm and dry.  Psychiatric: She has a normal mood and affect. Her behavior is normal. Judgment and thought content normal.  Vitals reviewed.         Assessment & Plan:

## 2018-03-27 NOTE — Patient Instructions (Signed)
Follow up in 4-6 weeks to recheck mood START the Buspirone 1/2 tab twice daily.  You can increase this to 1 tab twice daily after 2 weeks if needed Makes sure you are doing self care/stress management- getting adequate sleep, exercising regularly, journal writing/art. If your living situation allows, consider pet therapy.  There are multiple studies on the benefits of this Call with any questions or concerns Hang in there!!!

## 2018-05-01 ENCOUNTER — Ambulatory Visit: Payer: Self-pay | Admitting: *Deleted

## 2018-05-01 MED ORDER — NORGESTIMATE-ETH ESTRADIOL 0.25-35 MG-MCG PO TABS: 1.0000 | ORAL_TABLET | Freq: Every day | ORAL | 0 refills | Status: DC

## 2018-05-01 NOTE — Telephone Encounter (Signed)
i'm assuming the message is saying that the pharmacy is out of her medication.  Typically if they are out of 1 particular brand, they can substitute for another brand w/ the same hormonal makeup.  Is this not possible?  And if it's a problem w/ mail order, would she like us to try and send it locally?

## 2018-05-01 NOTE — Telephone Encounter (Signed)
Spoke with pt to inform. Pt was okay with sending to a local pharmacy. RX sent to [facility].

## 2018-05-01 NOTE — Telephone Encounter (Signed)
Message from Gerrianne ScaleAngela L Payne sent at 05/01/2018 1:52 PM EST   : pt calling stating that the is out of her norgestimate-ethinyl estradiol (ESTARYLLA) 0.25-35 MG-MCG tablet and that they don't know when they will have it in stock and that she would need another RX in place of the one she uses  [facility] [place] - [address] [phone] (Phone) [phone] (Fax)

## 2018-05-01 NOTE — Addendum Note (Signed)
Addended by: Zenovia JordanMITCHELL, TAYLOR B on: 05/01/2018 03:44 PM   Modules accepted: Orders

## 2018-05-07 ENCOUNTER — Other Ambulatory Visit: Payer: Self-pay | Admitting: Emergency Medicine

## 2018-05-07 MED ORDER — NORGESTIMATE-ETH ESTRADIOL 0.25-35 MG-MCG PO TABS: 1.0000 | ORAL_TABLET | Freq: Every day | ORAL | 0 refills | Status: DC

## 2018-05-11 ENCOUNTER — Encounter: Payer: Self-pay | Admitting: Family Medicine

## 2018-05-11 ENCOUNTER — Other Ambulatory Visit: Payer: Self-pay

## 2018-05-11 ENCOUNTER — Ambulatory Visit (INDEPENDENT_AMBULATORY_CARE_PROVIDER_SITE_OTHER): Admitting: Family Medicine

## 2018-05-11 VITALS — BP 110/68 | HR 82 | Temp 98.1°F | Resp 17 | Ht 60.0 in | Wt 116.0 lb

## 2018-05-11 DIAGNOSIS — Z0000 Encounter for general adult medical examination without abnormal findings: Secondary | ICD-10-CM | POA: Diagnosis not present

## 2018-05-11 DIAGNOSIS — Z23 Encounter for immunization: Secondary | ICD-10-CM

## 2018-05-11 DIAGNOSIS — F419 Anxiety disorder, unspecified: Secondary | ICD-10-CM | POA: Diagnosis not present

## 2018-05-11 MED ORDER — CITALOPRAM HYDROBROMIDE 10 MG PO TABS: 10.0000 mg | ORAL_TABLET | Freq: Every day | ORAL | 3 refills | Status: DC

## 2018-05-11 NOTE — Progress Notes (Signed)
   Subjective:    Patient ID: Carolyn Cummings, female    DOB: 12-24-1995, 23 y.o.   MRN: [id number]  HPI CPE- UTD on immunizations, will get flu shot today.  Due to start pap smears.  Student at [employer].  No vaping/smoking.  No drug use.  No binge drinking.  Using condoms.  No concerns for STDs.  Safe in relationship.  Anxiety- pt was started on Buspar at last visit but had to stop this b/c it mad her feel sick.  Had HAs and nausea.  No change in anxiety level- 'it gets worse w/ breaks b/c i'm bored'.  Difficult to be home.  Waiting to go back to school.    Review of Systems Patient reports no vision/ hearing changes, adenopathy,fever, weight change,  persistant/recurrent hoarseness , swallowing issues, chest pain, palpitations, edema, persistant/recurrent cough, hemoptysis, dyspnea (rest/exertional/paroxysmal nocturnal), gastrointestinal bleeding (melena, rectal bleeding), abdominal pain, significant heartburn, bowel changes, GU symptoms (dysuria, hematuria, incontinence), Gyn symptoms (abnormal  bleeding, pain),  syncope, focal weakness, memory loss, numbness & tingling, skin/hair/nail changes, abnormal bruising or bleeding.     Objective:   Physical Exam General Appearance:    Alert, cooperative, no distress, appears stated age  Head:    Normocephalic, without obvious abnormality, atraumatic  Eyes:    PERRL, conjunctiva/corneas clear, EOM's intact, fundi    benign, both eyes  Ears:    Normal TM's and external ear canals, both ears  Nose:   Nares normal, septum midline, mucosa normal, no drainage    or sinus tenderness  Throat:   Lips, mucosa, and tongue normal; teeth and gums normal  Neck:   Supple, symmetrical, trachea midline, no adenopathy;    Thyroid: no enlargement/tenderness/nodules  Back:     Symmetric, no curvature, ROM normal, no CVA tenderness  Lungs:     Clear to auscultation bilaterally, respirations unlabored  Chest Wall:    No tenderness or deformity   Heart:    Regular rate  and rhythm, S1 and S2 normal, no murmur, rub   or gallop  Breast Exam:    Deferred to pap visit  Abdomen:     Soft, non-tender, bowel sounds active all four quadrants,    no masses, no organomegaly  Genitalia:    Deferred to pap visit  Rectal:    Extremities:   Extremities normal, atraumatic, no cyanosis or edema  Pulses:   2+ and symmetric all extremities  Skin:   Skin color, texture, turgor normal, no rashes or lesions  Lymph nodes:   Cervical, supraclavicular, and axillary nodes normal  Neurologic:   CNII-XII intact, normal strength, sensation and reflexes    throughout          Assessment & Plan:

## 2018-05-11 NOTE — Patient Instructions (Signed)
Schedule your pap for next time you're home Follow up with me via phone or MyChart in 3-4 weeks to let me know how the anxiety is doing START the Citalopram once daily- take w/ food The nausea may be stress related- which can cause increased acid production in the stomach.  You can try and OTC Acid Reducer (like Pepcid, Prilosec, etc) and see if that helps Call with any questions or concerns Happy New Year!!!

## 2018-05-11 NOTE — Assessment & Plan Note (Signed)
Ongoing issue for pt.  Buspar caused nausea and HAs.  Will switch to low dose SSRI and monitor closely for improvement.  Suspect her nausea is partially stress related and likely causing excess acid production.  Also discussed OTC acid reducer.  Will follow.

## 2018-05-11 NOTE — Assessment & Plan Note (Signed)
Pt's PE WNL.  Due to start pap smears but pt wasn't prepared for that today.  Will have her schedule for next time she's home.  Flu shot given.  No need for labs.  Anticipatory guidance provided.

## 2018-07-03 ENCOUNTER — Ambulatory Visit: Admitting: Family Medicine

## 2018-07-08 ENCOUNTER — Ambulatory Visit (INDEPENDENT_AMBULATORY_CARE_PROVIDER_SITE_OTHER): Admitting: Family Medicine

## 2018-07-08 ENCOUNTER — Other Ambulatory Visit (HOSPITAL_COMMUNITY)
Admission: RE | Admit: 2018-07-08 | Discharge: 2018-07-08 | Disposition: A | Discharge status: Home / Self Care | Source: Ambulatory Visit | Attending: Family Medicine | Admitting: Family Medicine

## 2018-07-08 ENCOUNTER — Encounter: Payer: Self-pay | Admitting: Family Medicine

## 2018-07-08 ENCOUNTER — Other Ambulatory Visit: Payer: Self-pay

## 2018-07-08 VITALS — BP 102/64 | HR 88 | Temp 99.0°F | Resp 14 | Ht 60.0 in | Wt 118.0 lb

## 2018-07-08 DIAGNOSIS — Z23 Encounter for immunization: Secondary | ICD-10-CM | POA: Diagnosis not present

## 2018-07-08 DIAGNOSIS — Z202 Contact with and (suspected) exposure to infections with a predominantly sexual mode of transmission: Secondary | ICD-10-CM

## 2018-07-08 DIAGNOSIS — Z124 Encounter for screening for malignant neoplasm of cervix: Secondary | ICD-10-CM | POA: Diagnosis present

## 2018-07-08 NOTE — Assessment & Plan Note (Signed)
Pap collected.  Breast and GU exam WNL.  Check for STDs at pt's request- currently asymptomatic.  Declined HIV and RPR.

## 2018-07-08 NOTE — Addendum Note (Signed)
Addended by: Lenis Dickinson on: 07/08/2018 04:53 PM   Modules accepted: Orders

## 2018-07-08 NOTE — Progress Notes (Signed)
   Subjective:    Patient ID: Carolyn Cummings, female    DOB: [birth year], 23 y.o.   MRN: [id number]  HPI Pap only visit.  Pt wants STD screening.  No specific concerns.  Sexually active.  No vaginal discharge or itching or pain at this time.  Hx of recurrent yeast.  No breast concerns   Review of Systems For ROS see HPI     Objective:   Physical Exam Vitals signs reviewed. Exam conducted with a chaperone present.  Constitutional:      General: She is not in acute distress.    Appearance: Normal appearance. She is not ill-appearing.  HENT:     Head: Normocephalic and atraumatic.  Chest:     Breasts:        Right: Normal. No swelling, inverted nipple, mass, nipple discharge, skin change or tenderness.        Left: Normal. No swelling, inverted nipple, mass, nipple discharge, skin change or tenderness.  Genitourinary:    General: Normal vulva.     Pubic Area: No rash.      Labia:        Right: No rash, tenderness, lesion or injury.        Left: No rash, tenderness, lesion or injury.      Vagina: Normal. No vaginal discharge, erythema, tenderness, bleeding or lesions.     Cervix: No cervical motion tenderness, discharge, friability, lesion, erythema or cervical bleeding.     Uterus: Normal.      Adnexa: Right adnexa normal and left adnexa normal.     Rectum: No external hemorrhoid.  Lymphadenopathy:     Upper Body:     Right upper body: No supraclavicular, axillary or pectoral adenopathy.     Left upper body: No supraclavicular, axillary or pectoral adenopathy.  Neurological:     Mental Status: She is alert.           Assessment & Plan:

## 2018-07-08 NOTE — Patient Instructions (Addendum)
Follow up in 1 year or as needed We'll notify you of your lab results and make any changes if needed If anything changes with mood or anxiety- let me know so we can adjust meds Call with any questions or concerns Keep up the good work!  You look great!

## 2018-07-10 ENCOUNTER — Encounter: Payer: Self-pay | Admitting: General Practice

## 2018-07-10 ENCOUNTER — Encounter: Payer: Self-pay | Admitting: Family Medicine

## 2018-07-10 ENCOUNTER — Other Ambulatory Visit: Payer: Self-pay | Admitting: Family Medicine

## 2018-07-10 LAB — CYTOLOGY - PAP
Chlamydia: NEGATIVE
Diagnosis: NEGATIVE
HPV: NOT DETECTED
Neisseria Gonorrhea: NEGATIVE

## 2018-07-10 MED ORDER — METRONIDAZOLE 500 MG PO TABS: 500.0000 mg | ORAL_TABLET | Freq: Two times a day (BID) | ORAL | 0 refills | Status: DC

## 2018-07-10 NOTE — Progress Notes (Signed)
Flagyl sent

## 2018-07-13 LAB — CERVICOVAGINAL ANCILLARY ONLY: Herpes: NEGATIVE

## 2018-09-24 ENCOUNTER — Other Ambulatory Visit: Payer: Self-pay | Admitting: General Practice

## 2018-09-24 ENCOUNTER — Other Ambulatory Visit: Payer: Self-pay | Admitting: Family Medicine

## 2018-09-24 MED ORDER — NORGESTIMATE-ETH ESTRADIOL 0.25-35 MG-MCG PO TABS: 1.0000 | ORAL_TABLET | Freq: Every day | ORAL | 3 refills | Status: DC

## 2018-12-03 ENCOUNTER — Encounter: Payer: Self-pay | Admitting: Family Medicine

## 2018-12-18 ENCOUNTER — Telehealth: Admitting: Family

## 2018-12-18 DIAGNOSIS — N760 Acute vaginitis: Secondary | ICD-10-CM | POA: Diagnosis not present

## 2018-12-18 MED ORDER — METRONIDAZOLE 500 MG PO TABS: 500.0000 mg | ORAL_TABLET | Freq: Two times a day (BID) | ORAL | 0 refills | Status: DC

## 2018-12-18 NOTE — Progress Notes (Signed)

## 2019-01-05 ENCOUNTER — Encounter: Payer: Self-pay | Admitting: Family Medicine

## 2019-01-07 ENCOUNTER — Ambulatory Visit: Admitting: Physician Assistant

## 2019-01-07 ENCOUNTER — Other Ambulatory Visit: Payer: Self-pay | Admitting: Family Medicine

## 2019-01-08 ENCOUNTER — Encounter: Payer: Self-pay | Admitting: Family Medicine

## 2019-01-08 ENCOUNTER — Ambulatory Visit (INDEPENDENT_AMBULATORY_CARE_PROVIDER_SITE_OTHER): Admitting: Family Medicine

## 2019-01-08 ENCOUNTER — Other Ambulatory Visit: Payer: Self-pay

## 2019-01-08 VITALS — BP 110/60 | HR 80 | Temp 98.6°F | Resp 16 | Ht 60.0 in | Wt 120.8 lb

## 2019-01-08 DIAGNOSIS — L723 Sebaceous cyst: Secondary | ICD-10-CM

## 2019-01-08 DIAGNOSIS — Z23 Encounter for immunization: Secondary | ICD-10-CM

## 2019-01-08 NOTE — Patient Instructions (Signed)
Follow up as needed or as scheduled This is a sebaceous cyst.  They can come and go.  Warm compresses to the area will help bring it to a head and drain or reabsorb Sometimes these can drain- that's normal! They can get infected so if there's pain, redness, or pus- let me know! Call with any questions or concerns Stay Safe! Good luck with school!!

## 2019-01-08 NOTE — Progress Notes (Signed)
   Subjective:    Patient ID: Carolyn Cummings, female    DOB: 15-Nov-1995, 23 y.o.   MRN: [id number]  HPI Bump in groin- 'I feel like it's an ingrown hair'.  'it's kinda gone now'.  'it was really big and hard'.  Resolved within 2-3 days.  First appeared on Tuesday.  No drainage.  Not painful.  Nothing similar in the past.   Review of Systems For ROS see HPI     Objective:   Physical Exam Vitals signs reviewed.  Constitutional:      General: She is not in acute distress.    Appearance: Normal appearance. She is normal weight. She is not ill-appearing.  Skin:    General: Skin is warm and dry.     Findings: Lesion (<0.5 cm nontender sebaceous cyst in L panty line) present.  Neurological:     General: No focal deficit present.     Mental Status: She is alert and oriented to person, place, and time.  Psychiatric:        Mood and Affect: Mood normal.        Behavior: Behavior normal.        Thought Content: Thought content normal.           Assessment & Plan:  Sebaceous cyst- new.  Nearly resolved.  No evidence of infxn.  Reviewed dx and supportive care w/ pt.  Pt expressed understanding and is in agreement w/ plan.

## 2019-02-04 ENCOUNTER — Telehealth: Admitting: Physician Assistant

## 2019-02-04 DIAGNOSIS — R300 Dysuria: Secondary | ICD-10-CM

## 2019-02-04 MED ORDER — CEPHALEXIN 500 MG PO CAPS: 500.0000 mg | ORAL_CAPSULE | Freq: Two times a day (BID) | ORAL | 0 refills | Status: AC

## 2019-02-04 NOTE — Progress Notes (Signed)
I have spent 5 minutes in review of e-visit questionnaire, review and updating patient chart, medical decision making and response to patient.   Roshaun Pound Cody Roniya Tetro, PA-C    

## 2019-02-04 NOTE — Progress Notes (Signed)

## 2019-02-22 ENCOUNTER — Telehealth: Payer: Self-pay

## 2019-02-22 NOTE — Telephone Encounter (Signed)
Patient called in c/o UTI. MyChart visit with Cody 10.1.20. Script for Kelfex 500 mg BID x 7 days sent to pharmacy. Patient states that she took one pill, but then believed that she had a yeast infection instead of an UTI, so she bought OTC cream to treat. Symptoms subsided but have returned. Currently no relief with AZO, c/o urinary discomfort and burning. Has restarted Keflex this morning, one dose. Informed patient to take medication every 12 hours and I would route message to PCP. Patient states that she has recurrent UTI's and that Macrobid usually "works very fast" for her. Patient aware that appt may be needed or at least urine sample. Please advise

## 2019-02-22 NOTE — Telephone Encounter (Signed)
I agree w/ recommendation to take the course of Keflex since she started it.  This is very effective and concentrates well in the urine.  If she has symptoms after taking the Keflex twice daily she will need an in-office appt so we can get a urine culture

## 2019-02-22 NOTE — Telephone Encounter (Signed)
Patient notified of PCP recommendations and is agreement and expresses an understanding.   Ok for PEC to Discuss results / PCP recommendations / Schedule patient.   

## 2019-02-26 ENCOUNTER — Telehealth: Admitting: Nurse Practitioner

## 2019-02-26 DIAGNOSIS — B379 Candidiasis, unspecified: Secondary | ICD-10-CM | POA: Diagnosis not present

## 2019-02-26 MED ORDER — FLUCONAZOLE 150 MG PO TABS: 150.0000 mg | ORAL_TABLET | Freq: Once | ORAL | 0 refills | Status: AC

## 2019-02-26 NOTE — Progress Notes (Signed)
We are sorry that you are not feeling well. Here is how we plan to help! Based on what you shared with me it looks like you: May have a yeast vaginosis, most likely related to the antibiotic use.  Vaginosis is an inflammation of the vagina that can result in discharge, itching and pain. The cause is usually a change in the normal balance of vaginal bacteria or an infection. Vaginosis can also result from reduced estrogen levels after menopause.  The most common causes of vaginosis are:   Bacterial vaginosis which results from an overgrowth of one on several organisms that are normally present in your vagina.   Yeast infections which are caused by a naturally occurring fungus called candida.   Vaginal atrophy (atrophic vaginosis) which results from the thinning of the vagina from reduced estrogen levels after menopause.   Trichomoniasis which is caused by a parasite and is commonly transmitted by sexual intercourse.  Factors that increase your risk of developing vaginosis include: Marland Kitchen Medications, such as antibiotics and steroids . Uncontrolled diabetes . Use of hygiene products such as bubble bath, vaginal spray or vaginal deodorant . Douching . Wearing damp or tight-fitting clothing . Using an intrauterine device (IUD) for birth control . Hormonal changes, such as those associated with pregnancy, birth control pills or menopause . Sexual activity . Having a sexually transmitted infection  Your treatment plan is A single Diflucan (fluconazole) 150mg  tablet once.  I have electronically sent this prescription into the pharmacy that you have chosen.  Be sure to take all of the medication as directed. Stop taking any medication if you develop a rash, tongue swelling or shortness of breath. Mothers who are breast feeding should consider pumping and discarding their breast milk while on these antibiotics. However, there is no consensus that infant exposure at these doses would be harmful.   Remember that medication creams can weaken latex condoms.   HOME CARE:  Good hygiene may prevent some types of vaginosis from recurring and may relieve some symptoms:  . Avoid baths, hot tubs and whirlpool spas. Rinse soap from your outer genital area after a shower, and dry the area well to prevent irritation. Don't use scented or harsh soaps, such as those with deodorant or antibacterial action. Marland Kitchen Avoid irritants. These include scented tampons and pads. . Wipe from front to back after using the toilet. Doing so avoids spreading fecal bacteria to your vagina.  Other things that may help prevent vaginosis include:  Marland Kitchen Don't douche. Your vagina doesn't require cleansing other than normal bathing. Repetitive douching disrupts the normal organisms that reside in the vagina and can actually increase your risk of vaginal infection. Douching won't clear up a vaginal infection. . Use a latex condom. Both female and female latex condoms may help you avoid infections spread by sexual contact. . Wear cotton underwear. Also wear pantyhose with a cotton crotch. If you feel comfortable without it, skip wearing underwear to bed. Yeast thrives in Marland Kitchen Your symptoms should improve in the next day or two.  GET HELP RIGHT AWAY IF:  . You have pain in your lower abdomen ( pelvic area or over your ovaries) . You develop nausea or vomiting . You develop a fever . Your discharge changes or worsens . You have persistent pain with intercourse . You develop shortness of breath, a rapid pulse, or you faint.  These symptoms could be signs of problems or infections that need to be evaluated by a medical provider  now.  MAKE SURE YOU    Understand these instructions.  Will watch your condition.  Will get help right away if you are not doing well or get worse.  Your e-visit answers were reviewed by a board certified advanced clinical practitioner to complete your personal care plan. Depending  upon the condition, your plan could have included both over the counter or prescription medications. Please review your pharmacy choice to make sure that you have choses a pharmacy that is open for you to pick up any needed prescription, Your safety is important to [place]. If you have drug allergies check your prescription carefully.   You can use MyChart to ask questions about today's visit, request a non-urgent call back, or ask for a work or school excuse for 24 hours related to this e-Visit. If it has been greater than 24 hours you will need to follow up with your provider, or enter a new e-Visit to address those concerns. You will get a MyChart message within the next two days asking about your experience. I hope that your e-visit has been valuable and will speed your recovery.  I have spent 5-10 minutes reviewing and documenting in the patient's chart.

## 2019-03-08 ENCOUNTER — Other Ambulatory Visit: Payer: Self-pay

## 2019-03-08 DIAGNOSIS — Z20822 Contact with and (suspected) exposure to covid-19: Secondary | ICD-10-CM

## 2019-03-09 LAB — NOVEL CORONAVIRUS, NAA: SARS-CoV-2, NAA: NOT DETECTED

## 2019-03-10 ENCOUNTER — Other Ambulatory Visit: Payer: Self-pay

## 2019-03-10 DIAGNOSIS — Z20822 Contact with and (suspected) exposure to covid-19: Secondary | ICD-10-CM

## 2019-03-11 LAB — NOVEL CORONAVIRUS, NAA: SARS-CoV-2, NAA: NOT DETECTED

## 2019-03-12 ENCOUNTER — Telehealth: Admitting: Nurse Practitioner

## 2019-03-12 DIAGNOSIS — B373 Candidiasis of vulva and vagina: Secondary | ICD-10-CM | POA: Diagnosis not present

## 2019-03-12 DIAGNOSIS — B3731 Acute candidiasis of vulva and vagina: Secondary | ICD-10-CM

## 2019-03-12 MED ORDER — FLUCONAZOLE 150 MG PO TABS: 150.0000 mg | ORAL_TABLET | Freq: Once | ORAL | 0 refills | Status: AC

## 2019-03-12 NOTE — Progress Notes (Signed)
We are sorry that you are not feeling well. Here is how we plan to help! Based on what you shared with me it looks like you: May have a yeast vaginosis. Due to your recurrent symptoms, you should make an appointment with your primary cary physician of gynecologist to determine why your symptoms are reoccurring.  Medications will be prescribed for treatment at this time; however, if symptoms reoccur, you should follow up with your primary care physican or gynecologist.  Vaginosis is an inflammation of the vagina that can result in discharge, itching and pain. The cause is usually a change in the normal balance of vaginal bacteria or an infection. Vaginosis can also result from reduced estrogen levels after menopause.  The most common causes of vaginosis are:   Bacterial vaginosis which results from an overgrowth of one on several organisms that are normally present in your vagina.   Yeast infections which are caused by a naturally occurring fungus called candida.   Vaginal atrophy (atrophic vaginosis) which results from the thinning of the vagina from reduced estrogen levels after menopause.   Trichomoniasis which is caused by a parasite and is commonly transmitted by sexual intercourse.  Factors that increase your risk of developing vaginosis include: Marland Kitchen Medications, such as antibiotics and steroids . Uncontrolled diabetes . Use of hygiene products such as bubble bath, vaginal spray or vaginal deodorant . Douching . Wearing damp or tight-fitting clothing . Using an intrauterine device (IUD) for birth control . Hormonal changes, such as those associated with pregnancy, birth control pills or menopause . Sexual activity . Having a sexually transmitted infection  Your treatment plan is A single Diflucan (fluconazole) 150mg  tablet once.  I have electronically sent this prescription into the pharmacy that you have chosen.  Be sure to take all of the medication as directed. Stop taking any  medication if you develop a rash, tongue swelling or shortness of breath. Mothers who are breast feeding should consider pumping and discarding their breast milk while on these antibiotics. However, there is no consensus that infant exposure at these doses would be harmful.  Remember that medication creams can weaken latex condoms.   HOME CARE:  Good hygiene may prevent some types of vaginosis from recurring and may relieve some symptoms:  . Avoid baths, hot tubs and whirlpool spas. Rinse soap from your outer genital area after a shower, and dry the area well to prevent irritation. Don't use scented or harsh soaps, such as those with deodorant or antibacterial action. Marland Kitchen Avoid irritants. These include scented tampons and pads. . Wipe from front to back after using the toilet. Doing so avoids spreading fecal bacteria to your vagina.  Other things that may help prevent vaginosis include:  Marland Kitchen Don't douche. Your vagina doesn't require cleansing other than normal bathing. Repetitive douching disrupts the normal organisms that reside in the vagina and can actually increase your risk of vaginal infection. Douching won't clear up a vaginal infection. . Use a latex condom. Both female and female latex condoms may help you avoid infections spread by sexual contact. . Wear cotton underwear. Also wear pantyhose with a cotton crotch. If you feel comfortable without it, skip wearing underwear to bed. Yeast thrives in Marland Kitchen Your symptoms should improve in the next day or two.  GET HELP RIGHT AWAY IF:  . You have pain in your lower abdomen ( pelvic area or over your ovaries) . You develop nausea or vomiting . You develop a fever . Your  discharge changes or worsens . You have persistent pain with intercourse . You develop shortness of breath, a rapid pulse, or you faint.  These symptoms could be signs of problems or infections that need to be evaluated by a medical provider now.  MAKE SURE  YOU    Understand these instructions.  Will watch your condition.  Will get help right away if you are not doing well or get worse.  Your e-visit answers were reviewed by a board certified advanced clinical practitioner to complete your personal care plan. Depending upon the condition, your plan could have included both over the counter or prescription medications. Please review your pharmacy choice to make sure that you have choses a pharmacy that is open for you to pick up any needed prescription, Your safety is important to [place]. If you have drug allergies check your prescription carefully.   You can use MyChart to ask questions about today's visit, request a non-urgent call back, or ask for a work or school excuse for 24 hours related to this e-Visit. If it has been greater than 24 hours you will need to follow up with your provider, or enter a new e-Visit to address those concerns. You will get a MyChart message within the next two days asking about your experience. I hope that your e-visit has been valuable and will speed your recovery.  I have spent at least 5 minutes reviewing and documenting in the patient's chart.

## 2019-03-20 ENCOUNTER — Encounter: Payer: Self-pay | Admitting: Family Medicine

## 2019-03-24 ENCOUNTER — Other Ambulatory Visit: Payer: Self-pay

## 2019-03-24 DIAGNOSIS — Z20822 Contact with and (suspected) exposure to covid-19: Secondary | ICD-10-CM

## 2019-03-25 LAB — NOVEL CORONAVIRUS, NAA: SARS-CoV-2, NAA: NOT DETECTED

## 2019-03-30 ENCOUNTER — Other Ambulatory Visit: Payer: Self-pay

## 2019-03-30 DIAGNOSIS — Z20822 Contact with and (suspected) exposure to covid-19: Secondary | ICD-10-CM

## 2019-03-31 ENCOUNTER — Other Ambulatory Visit: Payer: Self-pay

## 2019-03-31 ENCOUNTER — Telehealth: Admitting: Family

## 2019-03-31 DIAGNOSIS — N390 Urinary tract infection, site not specified: Secondary | ICD-10-CM

## 2019-03-31 DIAGNOSIS — Z20822 Contact with and (suspected) exposure to covid-19: Secondary | ICD-10-CM

## 2019-03-31 LAB — NOVEL CORONAVIRUS, NAA: SARS-CoV-2, NAA: NOT DETECTED

## 2019-03-31 NOTE — Progress Notes (Signed)
Based on what you shared with me, I feel your condition warrants further evaluation and I recommend that you be seen for a face to face office visit.  After reviewing your chart it looks like you have been treated multiple times for UTIs and vaginal discharge, you need to be seen face-to-face for further testing tour rule out any serious problems.   NOTE: If you entered your credit card information for this eVisit, you will not be charged. You may see a "hold" on your card for the $35 but that hold will drop off and you will not have a charge processed.    If you are having a true medical emergency please call 911.      For an urgent face to face visit, [facility] has five urgent care centers for your convenience:      NEW:  [facility] [place] [facility] [place] Get Driving Directions [phone] [place], [place] [address] . 10 am - 6pm Monday - Friday    [facility]) Get Driving Directions [phone] [address] [place], [place] [address] . 10 am to 8 pm Monday-Friday . 12 pm to 8 pm [facility] Get Driving Directions [phone] [place], [place], [place] [address] . 8 am to 8 pm Monday-Friday . 9 am to 6 pm Saturday . 11 am to 6 pm Sunday     [facility] Get Driving Directions  [phone] [address] [place], [place] [address] . 8 am to 8 pm Monday-Friday . 8 am to 4 pm [facility] [place] Get Driving Directions [phone] [address] [place], [place] [address] . 12 pm to 6 pm Monday-Friday      Your e-visit answers were reviewed by a board certified advanced clinical practitioner to complete your personal care plan.  Thank you for using e-Visits.

## 2019-04-02 LAB — NOVEL CORONAVIRUS, NAA: SARS-CoV-2, NAA: NOT DETECTED

## 2019-04-12 ENCOUNTER — Other Ambulatory Visit: Payer: Self-pay

## 2019-04-12 ENCOUNTER — Encounter: Payer: Self-pay | Admitting: Family Medicine

## 2019-04-12 ENCOUNTER — Ambulatory Visit (INDEPENDENT_AMBULATORY_CARE_PROVIDER_SITE_OTHER): Admitting: Family Medicine

## 2019-04-12 VITALS — BP 112/74 | HR 78 | Temp 97.9°F | Resp 16 | Ht 60.0 in | Wt 118.0 lb

## 2019-04-12 DIAGNOSIS — K219 Gastro-esophageal reflux disease without esophagitis: Secondary | ICD-10-CM | POA: Diagnosis not present

## 2019-04-12 DIAGNOSIS — N390 Urinary tract infection, site not specified: Secondary | ICD-10-CM

## 2019-04-12 DIAGNOSIS — N760 Acute vaginitis: Secondary | ICD-10-CM

## 2019-04-12 DIAGNOSIS — R300 Dysuria: Secondary | ICD-10-CM | POA: Diagnosis not present

## 2019-04-12 LAB — POCT URINALYSIS DIPSTICK
Bilirubin, UA: NEGATIVE
Blood, UA: NEGATIVE
Glucose, UA: NEGATIVE
Ketones, UA: NEGATIVE
Leukocytes, UA: NEGATIVE
Nitrite, UA: NEGATIVE
Protein, UA: NEGATIVE
Spec Grav, UA: 1.02 (ref 1.010–1.025)
Urobilinogen, UA: 0.2 E.U./dL
pH, UA: 6 (ref 5.0–8.0)

## 2019-04-12 MED ORDER — OMEPRAZOLE 20 MG PO CPDR: 20.0000 mg | DELAYED_RELEASE_CAPSULE | Freq: Every day | ORAL | 3 refills | Status: DC

## 2019-04-12 MED ORDER — FLUCONAZOLE 150 MG PO TABS: ORAL_TABLET | ORAL | 0 refills | Status: DC

## 2019-04-12 MED ORDER — NITROFURANTOIN MACROCRYSTAL 100 MG PO CAPS: 100.0000 mg | ORAL_CAPSULE | Freq: Every day | ORAL | 0 refills | Status: DC

## 2019-04-12 NOTE — Progress Notes (Signed)
   Subjective:    Patient ID: Carolyn Cummings, female    DOB: 03-27-1996, 23 y.o.   MRN: [id number]  HPI Frequent UTIs- pt reports that last year she had a number of infxns at the beginning of the year 'and then they just stopped'.  Is again having infections.  Last infxn 11/25.  Seem to occur before and during menstrual cycles.    Frequent yeast infections- pt reports 'it's really itchy'.  Uses OTC yeast infxn medication.  Will have d/c 'sometimes'.  Yeast infections tend to occur around menstrual cycles as do UTIs.  Is wearing pads more often.  Gagging after eating- has urge to gag after every meal.  Doesn't feel acid coming up, denies nausea.  sxs started ~1 yr ago.  Some improvement w/ Pepcid.   Review of Systems For ROS see HPI   This visit occurred during the SARS-CoV-2 public health emergency.  Safety protocols were in place, including screening questions prior to the visit, additional usage of staff PPE, and extensive cleaning of exam room while observing appropriate contact time as indicated for disinfecting solutions.       Objective:   Physical Exam Vitals signs reviewed.  Constitutional:      General: She is not in acute distress.    Appearance: Normal appearance. She is not ill-appearing.  HENT:     Head: Normocephalic and atraumatic.  Skin:    General: Skin is warm and dry.  Neurological:     General: No focal deficit present.     Mental Status: She is alert and oriented to person, place, and time.  Psychiatric:        Mood and Affect: Mood normal.        Behavior: Behavior normal.        Thought Content: Thought content normal.           Assessment & Plan:  Recurrent UTI- pt reports this is occurring regularly w/ menses.  She is wearing pads rather than tampons which is likely trapping sweat and bacteria against the urethra.  Encouraged her to switch to tampons or to change pads more frequently.  Will start Macrodantin nightly during menses to decrease infxn.   Pt expressed understanding and is in agreement w/ plan.   Recurrent vaginitis- again, occurring w/ menses.  I suspect this is also due to wearing pads.  Encouraged a switch to tampons.  Prescription sent for Diflucan to take once during menses.  Pt expressed understanding and is in agreement w/ plan.   GERD- pt reports she had improvement in her gagging sxs when taking Pepcid but she stopped.  Will send script for Omeprazole to pharmacy for pt to start daily.  Pt expressed understanding and is in agreement w/ plan.

## 2019-04-12 NOTE — Patient Instructions (Signed)
Follow up in 2 months to recheck gagging and UTIs START the Omeprazole once daily to decrease acid production USE the Fluconazole (Diflucan) towards the middle to end of period to treat yeast USE the Macrodantin nightly during your period to prevent UTI SWITCH from pads to tampons and see if things improve Drink plenty of fluids and pee when you need to.  Don't hold it! Call with any questions or concerns Happy Holidays!!

## 2019-04-28 ENCOUNTER — Ambulatory Visit: Attending: Internal Medicine

## 2019-04-28 DIAGNOSIS — Z20822 Contact with and (suspected) exposure to covid-19: Secondary | ICD-10-CM

## 2019-04-29 LAB — NOVEL CORONAVIRUS, NAA: SARS-CoV-2, NAA: NOT DETECTED

## 2019-05-05 ENCOUNTER — Ambulatory Visit: Attending: Internal Medicine

## 2019-05-05 DIAGNOSIS — Z20822 Contact with and (suspected) exposure to covid-19: Secondary | ICD-10-CM

## 2019-05-06 ENCOUNTER — Other Ambulatory Visit

## 2019-05-06 LAB — NOVEL CORONAVIRUS, NAA: SARS-CoV-2, NAA: NOT DETECTED

## 2019-06-07 ENCOUNTER — Other Ambulatory Visit: Payer: Self-pay | Admitting: General Practice

## 2019-06-07 MED ORDER — OMEPRAZOLE 20 MG PO CPDR: 20.0000 mg | DELAYED_RELEASE_CAPSULE | Freq: Every day | ORAL | 1 refills | Status: DC

## 2019-06-16 ENCOUNTER — Other Ambulatory Visit: Payer: Self-pay

## 2019-06-16 ENCOUNTER — Ambulatory Visit (INDEPENDENT_AMBULATORY_CARE_PROVIDER_SITE_OTHER): Admitting: Family Medicine

## 2019-06-16 ENCOUNTER — Encounter: Payer: Self-pay | Admitting: Family Medicine

## 2019-06-16 VITALS — Ht 59.0 in | Wt 120.0 lb

## 2019-06-16 DIAGNOSIS — K219 Gastro-esophageal reflux disease without esophagitis: Secondary | ICD-10-CM | POA: Diagnosis not present

## 2019-06-16 DIAGNOSIS — N760 Acute vaginitis: Secondary | ICD-10-CM

## 2019-06-16 DIAGNOSIS — N390 Urinary tract infection, site not specified: Secondary | ICD-10-CM

## 2019-06-16 MED ORDER — NITROFURANTOIN MACROCRYSTAL 100 MG PO CAPS: 100.0000 mg | ORAL_CAPSULE | Freq: Every day | ORAL | 1 refills | Status: DC

## 2019-06-16 MED ORDER — FLUCONAZOLE 150 MG PO TABS: ORAL_TABLET | ORAL | 1 refills | Status: DC

## 2019-06-16 NOTE — Progress Notes (Signed)
   Virtual Visit via Video   I connected with patient on 06/16/19 at  1:00 PM EST by a video enabled telemedicine application and verified that I am speaking with the correct person using two identifiers.  Location patient: Home Location provider: [place], Office Persons participating in the virtual visit: Patient, Provider, CMA (Jess B)  I discussed the limitations of evaluation and management by telemedicine and the availability of in person appointments. The patient expressed understanding and agreed to proceed.  Subjective:   HPI:   Recurrent Vaginitis- pt was started on Diflucan to take during her menstrual cycle.  Pt reports that sxs have 'definitely' improved  Recurrent UTI- pt was started on Macrobid daily during menses to prevent recurrent UTI.  Pt reports no UTI sxs- burning, frequency, urgency.    Gagging- was started on PPI at last visit.  'it actually helps a lot'.  No longer having gagging after eating.  Pt is now able to eat what she wants, when she wants it.  ROS:   See pertinent positives and negatives per HPI.  Patient Active Problem List   Diagnosis Date Noted  . Pap smear for cervical cancer screening 07/08/2018  . Anxiety 03/27/2018  . Acne vulgaris 05/07/2017  . Oral contraceptive use 05/07/2017  . Physical exam 04/23/2016    Social History   Tobacco Use  . Smoking status: Never Smoker  . Smokeless tobacco: Never Used  Substance Use Topics  . Alcohol use: No    Current Outpatient Medications:  .  EPIDUO FORTE 0.3-2.5 % GEL, , Disp: , Rfl: 0 .  ESTARYLLA 0.25-35 MG-MCG tablet, TAKE 1 TABLET DAILY, Disp: 84 tablet, Rfl: 3 .  fluconazole (DIFLUCAN) 150 MG tablet, 1 tab monthly w/ menses, Disp: 6 tablet, Rfl: 0 .  nitrofurantoin (MACRODANTIN) 100 MG capsule, Take 1 capsule (100 mg total) by mouth at bedtime. During menses, Disp: 15 capsule, Rfl: 0 .  omeprazole (PRILOSEC) 20 MG capsule, Take 1 capsule (20 mg total) by mouth daily., Disp:  90 capsule, Rfl: 1  No Known Allergies  Objective:   Ht 4\' 11"  (1.499 m)   Wt 120 lb (54.4 kg)   BMI 24.24 kg/m   AAOx3, NAD NCAT, EOMI No obvious CN deficits Coloring WNL Pt is able to speak clearly, coherently without shortness of breath or increased work of breathing.  Thought process is linear.  Mood is appropriate.   Assessment and Plan:   Recurrent UTI- much improved since starting Macrodantin nightly during menses.  Refill provided.  Recurrent vaginitis- pt reports sxs are well controlled on Diflucan prn during menses.  Is pleased w/ the results.  No changes at this time  GERD- much improved since starting Omeprazole daily.  Currently asymptomatic.   , MD 06/16/2019

## 2019-06-16 NOTE — Progress Notes (Signed)
I have discussed the procedure for the virtual visit with the patient who has given consent to proceed with assessment and treatment.   Pt unable to obtain vitals.   Jessica L Brodmerkel, CMA     

## 2019-07-05 ENCOUNTER — Ambulatory Visit: Attending: Internal Medicine

## 2019-07-05 DIAGNOSIS — Z20822 Contact with and (suspected) exposure to covid-19: Secondary | ICD-10-CM

## 2019-07-06 LAB — NOVEL CORONAVIRUS, NAA: SARS-CoV-2, NAA: NOT DETECTED

## 2019-07-09 ENCOUNTER — Encounter: Admitting: Family Medicine

## 2019-07-12 ENCOUNTER — Telehealth: Payer: Self-pay | Admitting: Family Medicine

## 2019-07-12 ENCOUNTER — Ambulatory Visit

## 2019-07-12 NOTE — Telephone Encounter (Signed)
Ok for [employer] for TB screen

## 2019-07-12 NOTE — Telephone Encounter (Signed)
Please advise 

## 2019-07-12 NOTE — Telephone Encounter (Signed)
Pt called asking if she can come in for a nurse visit to have a tb test done. 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 Pt has a cpe scheduled for Friday, she also needs a copy of her immunization records printed as w

## 2019-07-12 NOTE — Telephone Encounter (Signed)
Patient is scheduled for a lab appointment for the quantiferon gold.

## 2019-07-12 NOTE — Telephone Encounter (Signed)
Called and left a message for pt to advise we have 2 options.   If quantiferon is preferred a lab visit today or tomorrow would be best to ensure lab results are back by Friday.   If pt needs TB injection ok for nurse visit Wednesday at 2:30pm so we can read results Friday at 2:30 in office for her appt.

## 2019-07-13 ENCOUNTER — Other Ambulatory Visit: Payer: Self-pay

## 2019-07-13 ENCOUNTER — Ambulatory Visit (INDEPENDENT_AMBULATORY_CARE_PROVIDER_SITE_OTHER)

## 2019-07-13 DIAGNOSIS — Z111 Encounter for screening for respiratory tuberculosis: Secondary | ICD-10-CM | POA: Diagnosis not present

## 2019-07-16 ENCOUNTER — Ambulatory Visit (INDEPENDENT_AMBULATORY_CARE_PROVIDER_SITE_OTHER): Admitting: Family Medicine

## 2019-07-16 ENCOUNTER — Encounter: Payer: Self-pay | Admitting: General Practice

## 2019-07-16 ENCOUNTER — Other Ambulatory Visit: Payer: Self-pay

## 2019-07-16 ENCOUNTER — Encounter: Payer: Self-pay | Admitting: Family Medicine

## 2019-07-16 VITALS — BP 110/70 | HR 76 | Temp 98.4°F | Resp 16 | Ht 59.0 in | Wt 119.1 lb

## 2019-07-16 DIAGNOSIS — Z0000 Encounter for general adult medical examination without abnormal findings: Secondary | ICD-10-CM | POA: Diagnosis not present

## 2019-07-16 LAB — QUANTIFERON-TB GOLD PLUS
Mitogen-NIL: 9.19 IU/mL
NIL: 0.13 IU/mL
QuantiFERON-TB Gold Plus: NEGATIVE
TB1-NIL: <0 IU/mL
TB2-NIL: <0 IU/mL

## 2019-07-16 NOTE — Assessment & Plan Note (Signed)
Pt's PE WNL.  UTD on immunizations.  No need to check labs.  Anticipatory guidance provided.

## 2019-07-16 NOTE — Progress Notes (Signed)
   Subjective:    Patient ID: Carolyn Cummings, female    DOB: 1996-05-06, 24 y.o.   MRN: [id number]  HPI CPE- UTD on pap, immunizations.  Currently job hunting- looking for front desk job.  Plans to return to school for nursing- [employer].  No concerns.  Dating same guy x3 yrs- feels safe in relationship.  On birth control.  No concerns for STIs.  No vaping or smoking.  No drug use.  No binge drinking.  Good relationship w/ parents.   Review of Systems Patient reports no vision/ hearing changes, adenopathy,fever, weight change,  persistant/recurrent hoarseness , swallowing issues, chest pain, palpitations, edema, persistant/recurrent cough, hemoptysis, dyspnea (rest/exertional/paroxysmal nocturnal), gastrointestinal bleeding (melena, rectal bleeding), abdominal pain, significant heartburn, bowel changes, GU symptoms (dysuria, hematuria, incontinence), Gyn symptoms (abnormal  bleeding, pain),  syncope, focal weakness, memory loss, numbness & tingling, skin/hair/nail changes, abnormal bruising or bleeding, anxiety, or depression.   This visit occurred during the SARS-CoV-2 public health emergency.  Safety protocols were in place, including screening questions prior to the visit, additional usage of staff PPE, and extensive cleaning of exam room while observing appropriate contact time as indicated for disinfecting solutions.       Objective:   Physical Exam General Appearance:    Alert, cooperative, no distress, appears stated age  Head:    Normocephalic, without obvious abnormality, atraumatic  Eyes:    PERRL, conjunctiva/corneas clear, EOM's intact, fundi    benign, both eyes  Ears:    Normal TM's and external ear canals, both ears  Nose:   Deferred due to COVID  Throat:   Neck:   Supple, symmetrical, trachea midline, no adenopathy;    Thyroid: no enlargement/tenderness/nodules  Back:     Symmetric, no curvature, ROM normal, no CVA tenderness  Lungs:     Clear to auscultation bilaterally,  respirations unlabored  Chest Wall:    No tenderness or deformity   Heart:    Regular rate and rhythm, S1 and S2 normal, no murmur, rub   or gallop  Breast Exam:    Deferred  Abdomen:     Soft, non-tender, bowel sounds active all four quadrants,    no masses, no organomegaly  Genitalia:    Deferred  Rectal:    Extremities:   Extremities normal, atraumatic, no cyanosis or edema  Pulses:   2+ and symmetric all extremities  Skin:   Skin color, texture, turgor normal, no rashes or lesions  Lymph nodes:   Cervical, supraclavicular, and axillary nodes normal  Neurologic:   CNII-XII intact, normal strength, sensation and reflexes    throughout          Assessment & Plan:

## 2019-07-16 NOTE — Patient Instructions (Signed)
Follow up in 1 year or as needed No need for labs today- you look great!! Keep up the good work on healthy diet and regular exercise- you look great! Call with any questions or concerns Stay Safe!  Stay Healthy!!

## 2019-08-07 ENCOUNTER — Ambulatory Visit: Attending: Internal Medicine

## 2019-08-07 DIAGNOSIS — Z23 Encounter for immunization: Secondary | ICD-10-CM

## 2019-08-07 NOTE — Progress Notes (Signed)
   Covid-19 Vaccination Clinic  Name:  Carolyn Cummings    MRN: [id number] DOB: [birth year]  08/07/2019  [name] was observed post Covid-19 immunization for 15 minutes without incident. She was provided with Vaccine Information Sheet and instruction to access the V-Safe system.   [name] was instructed to call 911 with any severe reactions post vaccine: Marland Kitchen Difficulty breathing  . Swelling of face and throat  . A fast heartbeat  . A bad rash all over body  . Dizziness and weakness   Immunizations Administered    Name Date Dose VIS Date Route   Pfizer COVID-19 Vaccine 08/07/2019 10:09 AM 0.3 mL 04/16/2019 Intramuscular   Manufacturer: [employer], [employer]   Lot: [id number]   NDC: [address]-0

## 2019-08-10 ENCOUNTER — Encounter: Admitting: Family Medicine

## 2019-08-18 ENCOUNTER — Encounter: Payer: Self-pay | Admitting: Family Medicine

## 2019-08-31 ENCOUNTER — Encounter: Payer: Self-pay | Admitting: Family Medicine

## 2019-09-01 ENCOUNTER — Ambulatory Visit

## 2019-09-04 ENCOUNTER — Ambulatory Visit: Attending: Internal Medicine

## 2019-09-04 ENCOUNTER — Other Ambulatory Visit: Payer: Self-pay

## 2019-09-04 DIAGNOSIS — Z23 Encounter for immunization: Secondary | ICD-10-CM

## 2019-09-04 NOTE — Progress Notes (Signed)
   Covid-19 Vaccination Clinic  Name:  Carolyn Cummings    MRN: [id number] DOB: May 31, 1995  09/04/2019  [name] was observed post Covid-19 immunization for 15 minutes without incident. She was provided with Vaccine Information Sheet and instruction to access the V-Safe system.   Ms. Line was instructed to call 911 with any severe reactions post vaccine: Marland Kitchen Difficulty breathing  . Swelling of face and throat  . A fast heartbeat  . A bad rash all over body  . Dizziness and weakness   Immunizations Administered    Name Date Dose VIS Date Route   Pfizer COVID-19 Vaccine 09/04/2019 10:00 AM 0.3 mL 06/30/2018 Intramuscular   Manufacturer: [employer], [employer]   Lot: [id number]   NDC: [address]-3

## 2019-09-18 ENCOUNTER — Other Ambulatory Visit: Payer: Self-pay | Admitting: Family Medicine

## 2019-10-08 ENCOUNTER — Encounter: Payer: Self-pay | Admitting: Family Medicine

## 2019-10-08 ENCOUNTER — Telehealth (INDEPENDENT_AMBULATORY_CARE_PROVIDER_SITE_OTHER): Admitting: Family Medicine

## 2019-10-08 ENCOUNTER — Other Ambulatory Visit: Payer: Self-pay

## 2019-10-08 ENCOUNTER — Other Ambulatory Visit

## 2019-10-08 VITALS — Temp 98.6°F | Ht 59.0 in | Wt 122.0 lb

## 2019-10-08 DIAGNOSIS — J329 Chronic sinusitis, unspecified: Secondary | ICD-10-CM | POA: Diagnosis not present

## 2019-10-08 DIAGNOSIS — B9789 Other viral agents as the cause of diseases classified elsewhere: Secondary | ICD-10-CM

## 2019-10-08 NOTE — Progress Notes (Signed)
   Virtual Visit via Video   I connected with patient on 10/08/19 at  2:00 PM EDT by a video enabled telemedicine application and verified that I am speaking with the correct person using two identifiers.  Location patient: Home Location provider: [facility], Office Persons participating in the virtual visit: Patient, Provider, CMA (Jess B)  I discussed the limitations of evaluation and management by telemedicine and the availability of in person appointments. The patient expressed understanding and agreed to proceed.  Subjective:   HPI:   URI- 'I don't think it's COVID'.  Has test pending.  No fever, no chills.  + nasal congestion.  + PND, sore throat.  + facial pressure but no TTP.  No tooth pain.  + HA.  sxs started a few days ago 'and then today it just hit me'.  + sick contacts.  No hx of seasonal allergies.  No cough currently.  + head pressure when leaning forward.  No blood in nasal drainage.    ROS:   See pertinent positives and negatives per HPI.  Patient Active Problem List   Diagnosis Date Noted  . Pap smear for cervical cancer screening 07/08/2018  . Anxiety 03/27/2018  . Acne vulgaris 05/07/2017  . Oral contraceptive use 05/07/2017  . Physical exam 04/23/2016    Social History   Tobacco Use  . Smoking status: Never Smoker  . Smokeless tobacco: Never Used  Substance Use Topics  . Alcohol use: No    Current Outpatient Medications:  .  EPIDUO FORTE 0.3-2.5 % GEL, , Disp: , Rfl: 0 .  ESTARYLLA 0.25-35 MG-MCG tablet, TAKE 1 TABLET DAILY, Disp: 84 tablet, Rfl: 3 .  nitrofurantoin (MACRODANTIN) 100 MG capsule, TAKE 1 CAPSULE AT BEDTIME DURING MENSES, Disp: 45 capsule, Rfl: 7 .  omeprazole (PRILOSEC) 20 MG capsule, Take 1 capsule (20 mg total) by mouth daily., Disp: 90 capsule, Rfl: 1  No Known Allergies  Objective:   Temp 98.6 F (37 C) (Tympanic)   Ht 4\' 11"  (1.499 m)   Wt 122 lb (55.3 kg)   BMI 24.64 kg/m   AAOx3, NAD NCAT, EOMI No obvious  CN deficits Coloring WNL Pt is able to speak clearly, coherently without shortness of breath or increased work of breathing.  Thought process is linear.  Mood is appropriate.   Assessment and Plan:   Viral sinusitis- new.  Pt's sxs are consistent w/ viral illness at this time.  No red flags for bacterial infxn.  No need for abx.  Start daily antihistamine, OTC phenylephrine, tylenol/ibuprofen, and lots of fluids.  Reviewed supportive care and red flags that should prompt return.  Pt expressed understanding and is in agreement w/ plan.    , MD 10/08/2019

## 2019-10-08 NOTE — Progress Notes (Signed)
I have discussed the procedure for the virtual visit with the patient who has given consent to proceed with assessment and treatment.   [name] L Kierrah Kilbride, CMA     

## 2019-11-08 ENCOUNTER — Other Ambulatory Visit: Payer: Self-pay | Admitting: Family Medicine

## 2019-12-09 ENCOUNTER — Other Ambulatory Visit: Payer: Self-pay | Admitting: Family Medicine

## 2020-01-13 ENCOUNTER — Telehealth (INDEPENDENT_AMBULATORY_CARE_PROVIDER_SITE_OTHER): Admitting: Family Medicine

## 2020-01-13 ENCOUNTER — Encounter: Payer: Self-pay | Admitting: Family Medicine

## 2020-01-13 ENCOUNTER — Other Ambulatory Visit: Payer: Self-pay

## 2020-01-13 VITALS — Temp 98.8°F | Wt 120.0 lb

## 2020-01-13 DIAGNOSIS — J020 Streptococcal pharyngitis: Secondary | ICD-10-CM | POA: Diagnosis not present

## 2020-01-13 MED ORDER — AMOXICILLIN 875 MG PO TABS
875.0000 mg | ORAL_TABLET | Freq: Two times a day (BID) | ORAL | 0 refills | Status: DC
Start: 2020-01-13 — End: 2021-07-25

## 2020-01-13 NOTE — Progress Notes (Signed)
   Virtual Visit via Video   I connected with patient on 01/13/20 at  2:30 PM EDT by a video enabled telemedicine application and verified that I am speaking with the correct person using two identifiers.  Location patient: Home Location provider: [facility], Office Persons participating in the virtual visit: Patient, Provider, CMA (Jess B)  I discussed the limitations of evaluation and management by telemedicine and the availability of in person appointments. The patient expressed understanding and agreed to proceed.  Subjective:   HPI:   Sore throat- pt reports tonsils are 'really inflamed and have white dots on them'.  L has a white patch and R has 'dots'.  No fever.  Painful to swallow.  + swollen glands.  No HA, abd pain, nausea.  Denies fatigue.  Pt has hx of strep when younger.  No known sick contacts.    ROS:   See pertinent positives and negatives per HPI.  Patient Active Problem List   Diagnosis Date Noted  . Pap smear for cervical cancer screening 07/08/2018  . Anxiety 03/27/2018  . Acne vulgaris 05/07/2017  . Oral contraceptive use 05/07/2017  . Physical exam 04/23/2016    Social History   Tobacco Use  . Smoking status: Never Smoker  . Smokeless tobacco: Never Used  Substance Use Topics  . Alcohol use: No    Current Outpatient Medications:  .  EPIDUO FORTE 0.3-2.5 % GEL, , Disp: , Rfl: 0 .  nitrofurantoin (MACRODANTIN) 100 MG capsule, TAKE 1 CAPSULE AT BEDTIME DURING MENSES, Disp: 45 capsule, Rfl: 7 .  norgestimate-ethinyl estradiol (ORTHO-CYCLEN) 0.25-35 MG-MCG tablet, TAKE 1 TABLET DAILY, Disp: 84 tablet, Rfl: 3 .  omeprazole (PRILOSEC) 20 MG capsule, TAKE 1 CAPSULE DAILY, Disp: 90 capsule, Rfl: 3  No Known Allergies  Objective:   Temp 98.8 F (37.1 C) (Oral)   Wt 120 lb (54.4 kg)   BMI 24.24 kg/m  AAOx3, NAD NCAT, EOMI No obvious CN deficits Coloring WNL Pt is able to speak clearly, coherently without shortness of breath or increased  work of breathing.  Thought process is linear.  Mood is appropriate.   Assessment and Plan:   Strep- new.  Pt's description of her tonsils and abrupt onset are consistent w/ strep.  Discussed the possibility of mono but pt denies fatigue or other systemic sxs.  Start amox.  Did caution her that if she broke out in a rash, this was mono and she would need to stop abx.  Pt expressed understanding and is in agreement w/ plan.    Neena Rhymes, MD 01/13/2020

## 2020-01-13 NOTE — Progress Notes (Signed)
I have discussed the procedure for the virtual visit with the patient who has given consent to proceed with assessment and treatment.   Lawrie Tunks L Darnell Stimson, CMA     

## 2020-02-03 ENCOUNTER — Other Ambulatory Visit: Payer: Self-pay | Admitting: Family Medicine

## 2020-02-03 MED ORDER — PREDNISONE 10 MG PO TABS
ORAL_TABLET | ORAL | 0 refills | Status: DC
Start: 2020-02-03 — End: 2021-07-25

## 2020-02-03 NOTE — Progress Notes (Signed)
Prednisone sent for swollen tonsils

## 2020-03-01 ENCOUNTER — Encounter: Payer: Self-pay | Admitting: Family Medicine

## 2020-03-02 ENCOUNTER — Telehealth: Admitting: Family Medicine

## 2020-04-05 ENCOUNTER — Telehealth: Payer: Self-pay | Admitting: Family Medicine

## 2020-04-05 NOTE — Telephone Encounter (Signed)
Ok to schedule nurse visit for [employer] and flu shot

## 2020-04-05 NOTE — Telephone Encounter (Signed)
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 She had a cpe in March of 2021. Ok to scheduled as a nurse visit for the Tb and flu shot?   She would like to have the quantiferon test done.  Please advise and pt can be reached at the home #

## 2020-04-06 ENCOUNTER — Other Ambulatory Visit: Payer: Self-pay | Admitting: *Deleted

## 2020-04-06 DIAGNOSIS — Z111 Encounter for screening for respiratory tuberculosis: Secondary | ICD-10-CM

## 2020-04-06 NOTE — Telephone Encounter (Signed)
Called pt and schedule nurse visit on 12/6 @ 1:30. Quantiferon lab order entered.

## 2020-04-10 ENCOUNTER — Ambulatory Visit (INDEPENDENT_AMBULATORY_CARE_PROVIDER_SITE_OTHER)

## 2020-04-10 ENCOUNTER — Other Ambulatory Visit: Payer: Self-pay

## 2020-04-10 DIAGNOSIS — Z23 Encounter for immunization: Secondary | ICD-10-CM | POA: Diagnosis not present

## 2020-04-10 DIAGNOSIS — Z111 Encounter for screening for respiratory tuberculosis: Secondary | ICD-10-CM | POA: Diagnosis not present

## 2020-04-12 LAB — QUANTIFERON-TB GOLD PLUS
Mitogen-NIL: >10 IU/mL
NIL: 0.17 IU/mL
QuantiFERON-TB Gold Plus: NEGATIVE
TB1-NIL: <0 IU/mL
TB2-NIL: <0 IU/mL

## 2020-04-14 ENCOUNTER — Ambulatory Visit: Attending: Internal Medicine

## 2020-04-14 DIAGNOSIS — Z23 Encounter for immunization: Secondary | ICD-10-CM

## 2020-04-14 NOTE — Progress Notes (Signed)
   Covid-19 Vaccination Clinic  Name:  Carolyn Cummings    MRN: [id number] DOB: [birth year]  04/14/2020  [name] was observed post Covid-19 immunization for 15 minutes without incident. She was provided with Vaccine Information Sheet and instruction to access the V-Safe system.   [name] was instructed to call 911 with any severe reactions post vaccine: Marland Kitchen Difficulty breathing  . Swelling of face and throat  . A fast heartbeat  . A bad rash all over body  . Dizziness and weakness   Immunizations Administered    Name Date Dose VIS Date Route   Pfizer COVID-19 Vaccine 04/14/2020  1:17 PM 0.3 mL 02/23/2020 Intramuscular   Manufacturer: [employer], [employer]   Lot: [id number]   NDC: [address]-6

## 2020-05-04 ENCOUNTER — Encounter: Payer: Self-pay | Admitting: Family Medicine

## 2020-11-01 ENCOUNTER — Encounter: Payer: Self-pay | Admitting: *Deleted

## 2020-11-09 ENCOUNTER — Other Ambulatory Visit: Payer: Self-pay

## 2020-11-09 ENCOUNTER — Other Ambulatory Visit: Payer: Self-pay | Admitting: Family Medicine

## 2020-11-09 ENCOUNTER — Telehealth: Payer: Self-pay

## 2020-11-09 MED ORDER — NORGESTIMATE-ETH ESTRADIOL 0.25-35 MG-MCG PO TABS: 1.0000 | ORAL_TABLET | Freq: Every day | ORAL | 3 refills | Status: DC

## 2020-11-09 NOTE — Addendum Note (Signed)
Addended by: Sheliah Hatch on: 11/09/2020 01:40 PM   Modules accepted: Orders

## 2020-11-09 NOTE — Telephone Encounter (Signed)
Patient is requesting Estarylla 0.25-35mg   Lv 01/13/20 Nv n/a 84 tabs 3 refills

## 2021-02-15 ENCOUNTER — Ambulatory Visit

## 2021-02-23 ENCOUNTER — Telehealth: Payer: Self-pay | Admitting: Family Medicine

## 2021-02-23 MED ORDER — NORGESTIMATE-ETH ESTRADIOL 0.25-35 MG-MCG PO TABS: 1.0000 | ORAL_TABLET | Freq: Every day | ORAL | 0 refills | Status: DC

## 2021-02-23 NOTE — Telephone Encounter (Signed)
Patient has requested a 90 day prescription for her birth control pills.  [facility] will send prior authorization.

## 2021-02-23 NOTE — Telephone Encounter (Signed)
Please call to schedule and appt with Dr Beverely Low, she hasn't been seen in over a year. Thank you!

## 2021-02-23 NOTE — Telephone Encounter (Signed)
Patient hasn't been seen in over a year, please advise

## 2021-02-23 NOTE — Telephone Encounter (Signed)
Prescription sent locally to [facility].  This will be last refill w/o appt

## 2021-02-26 NOTE — Telephone Encounter (Signed)
Called and spoke to the patient she is going to call back about Medication Follow up appt

## 2021-03-13 ENCOUNTER — Ambulatory Visit

## 2021-03-15 ENCOUNTER — Ambulatory Visit

## 2021-03-16 ENCOUNTER — Ambulatory Visit

## 2021-05-22 ENCOUNTER — Encounter: Admitting: Family Medicine

## 2021-06-15 ENCOUNTER — Encounter: Payer: Self-pay | Admitting: Family Medicine

## 2021-07-02 NOTE — Telephone Encounter (Signed)
Hello, I do not remember making an appointment for 3/6. I would let to cancel this. Also is there any gynecology offices in the area that you could make a referral to?

## 2021-07-09 ENCOUNTER — Encounter: Admitting: Family Medicine

## 2021-07-23 ENCOUNTER — Encounter: Payer: Self-pay | Admitting: Family Medicine

## 2021-07-25 ENCOUNTER — Ambulatory Visit (INDEPENDENT_AMBULATORY_CARE_PROVIDER_SITE_OTHER): Payer: No Typology Code available for payment source | Admitting: Registered Nurse

## 2021-07-25 ENCOUNTER — Encounter: Payer: Self-pay | Admitting: Registered Nurse

## 2021-07-25 ENCOUNTER — Other Ambulatory Visit: Payer: Self-pay

## 2021-07-25 ENCOUNTER — Other Ambulatory Visit (HOSPITAL_COMMUNITY)
Admission: RE | Admit: 2021-07-25 | Discharge: 2021-07-25 | Disposition: A | Discharge status: Home / Self Care | Payer: No Typology Code available for payment source | Source: Ambulatory Visit | Attending: Registered Nurse | Admitting: Registered Nurse

## 2021-07-25 VITALS — BP 113/74 | HR 78 | Temp 98.2°F | Resp 18 | Ht 59.0 in | Wt 123.0 lb

## 2021-07-25 DIAGNOSIS — N898 Other specified noninflammatory disorders of vagina: Secondary | ICD-10-CM | POA: Diagnosis present

## 2021-07-25 DIAGNOSIS — Z3009 Encounter for other general counseling and advice on contraception: Secondary | ICD-10-CM

## 2021-07-25 DIAGNOSIS — Z124 Encounter for screening for malignant neoplasm of cervix: Secondary | ICD-10-CM | POA: Diagnosis present

## 2021-07-25 MED ORDER — FLUCONAZOLE 150 MG PO TABS: 150.0000 mg | ORAL_TABLET | Freq: Once | ORAL | 0 refills | Status: AC

## 2021-07-25 NOTE — Progress Notes (Signed)
? ?Established Patient Office Visit ? ?Subjective:  ?Patient ID: Carolyn Cummings, female    DOB: 1995/12/09  Age: 26 y.o. MRN: [id number] ? ?CC:  ?Chief Complaint  ?Patient presents with  ? Vaginal Itching  ?  Patient states she has been having some vaginal itching, white discharge  ? ? ?HPI ?Carolyn Cummings presents for pap,vaginal itching, bc counseling ? ?Pap ?Due, no hx abnormal ? ?Vaginal itching ?Two weeks ?Some discharge  ?Feels like candidal infection ?Has tx with otc monistat, some relief. ? ?Contraception ?Interested in longer acting ?Cannot take pill reliably - forgets ?Just finished cycle. Did have upic within last mo. ?Wants to start depo to bridge to nexplanon with gyn ?Needs gyn referral.  ? ? ?Outpatient Medications Prior to Visit  ?Medication Sig Dispense Refill  ? EPIDUO FORTE 0.3-2.5 % GEL   0  ? nitrofurantoin (MACRODANTIN) 100 MG capsule TAKE 1 CAPSULE AT BEDTIME DURING MENSES 45 capsule 7  ? norgestimate-ethinyl estradiol (ESTARYLLA) 0.25-35 MG-MCG tablet Take 1 tablet by mouth daily. LAST REFILL W/O APPT 84 tablet 0  ? omeprazole (PRILOSEC) 20 MG capsule TAKE 1 CAPSULE DAILY 90 capsule 3  ? amoxicillin (AMOXIL) 875 MG tablet Take 1 tablet (875 mg total) by mouth 2 (two) times daily. 20 tablet 0  ? predniSONE (DELTASONE) 10 MG tablet 3 tabs x3 days and then 2 tabs x3 days and then 1 tab x3 days.  Take w/ food. 18 tablet 0  ? ?No facility-administered medications prior to visit.  ? ? ?Review of Systems  ?Constitutional: Negative.   ?HENT: Negative.    ?Eyes: Negative.   ?Respiratory: Negative.    ?Cardiovascular: Negative.   ?Gastrointestinal: Negative.   ?Genitourinary:  Positive for vaginal discharge.  ?Musculoskeletal: Negative.   ?Skin: Negative.   ?Neurological: Negative.   ?Psychiatric/Behavioral: Negative.    ?All other systems reviewed and are negative. ? ?  ?Objective:  ?  ? ?BP 113/74   Pulse 78   Temp 98.2 ?F (36.8 ?C) (Temporal)   Resp 18   Ht 4\' 11"  (1.499 m)   Wt 123 lb (55.8  kg)   SpO2 100%   BMI 24.84 kg/m?  ? ?Wt Readings from Last 3 Encounters:  ?07/25/21 123 lb (55.8 kg)  ?01/13/20 120 lb (54.4 kg)  ?10/08/19 122 lb (55.3 kg)  ? ?Physical Exam ?Vitals and nursing note reviewed. Exam conducted with a chaperone present.  ?Constitutional:   ?   General: She is not in acute distress. ?   Appearance: Normal appearance. She is normal weight. She is not ill-appearing, toxic-appearing or diaphoretic.  ?Cardiovascular:  ?   Rate and Rhythm: Normal rate and regular rhythm.  ?   Heart sounds: Normal heart sounds. No murmur heard. ?  No friction rub. No gallop.  ?Pulmonary:  ?   Effort: Pulmonary effort is normal. No respiratory distress.  ?   Breath sounds: Normal breath sounds. No stridor. No wheezing, rhonchi or rales.  ?Chest:  ?   Chest wall: No tenderness.  ?Abdominal:  ?   Hernia: There is no hernia in the left inguinal area or right inguinal area.  ?Genitourinary: ?   Exam position: Knee-chest position.  ?   Pubic Area: No rash or pubic lice.   ?   Labia:     ?   Right: No rash, tenderness, lesion or injury.     ?   Left: No rash, tenderness, lesion or injury.   ?   Urethra: No prolapse, urethral pain,  urethral swelling or urethral lesion.  ?   Vagina: Normal.  ?   Cervix: Normal.  ?   Uterus: Normal.   ?   Adnexa: Right adnexa normal and left adnexa normal.  ?   Rectum: Normal.  ?Lymphadenopathy:  ?   Lower Body: No right inguinal adenopathy. No left inguinal adenopathy.  ?Skin: ?   General: Skin is warm and dry.  ?Neurological:  ?   General: No focal deficit present.  ?   Mental Status: She is alert and oriented to person, place, and time. Mental status is at baseline.  ?Psychiatric:     ?   Mood and Affect: Mood normal.     ?   Behavior: Behavior normal.     ?   Thought Content: Thought content normal.     ?   Judgment: Judgment normal.  ? ? ?No results found for any visits on 07/25/21. ? ? ? ?The ASCVD Risk score (Arnett DK, et al., 2019) failed to calculate for the following  reasons: ?  The 2019 ASCVD risk score is only valid for ages 60 to 41 ? ?  ?Assessment & Plan:  ? ?Problem List Items Addressed This Visit   ?None ?Visit Diagnoses   ? ? Papanicolaou smear    -  Primary  ? Relevant Orders  ? Cytology - PAP  ? Vaginal discharge      ? Relevant Medications  ? fluconazole (DIFLUCAN) 150 MG tablet  ? Other Relevant Orders  ? Cytology - PAP  ? Birth control counseling      ? Relevant Orders  ? Ambulatory referral to Gynecology  ? ?  ? ? ?Meds ordered this encounter  ?Medications  ? fluconazole (DIFLUCAN) 150 MG tablet  ?  Sig: Take 1 tablet (150 mg total) by mouth once for 1 dose.  ?  Dispense:  1 tablet  ?  Refill:  0  ?  Order Specific Question:   Supervising Provider  ?  Answer:   Carlota Raspberry, JEFFREY R [[id number]]  ? ? ?Return in about 1 week (around 08/01/2021) for Preg test and depo start. .  ? ?PLAN ?Pap sent ?Ct, gc, trich testing sent ?Return in 1 week with compliance on COCs for preg test, depo start ?Patient encouraged to call clinic with any questions, comments, or concerns. ? ?Maximiano Coss, NP ?

## 2021-07-25 NOTE — Patient Instructions (Signed)
Carolyn Cummings -  ? ?Great to meet you ? ?See you in 1 week for pregnancy test and depo start. This will reliably provide contraception through when Gyn can get you seen for nexplanon  ? ?Have sent diflucan ? ?Will update you on results ? ?Thanks, ? ?Rich  ?

## 2021-07-27 LAB — CYTOLOGY - PAP
Adequacy: ABSENT
Chlamydia: NEGATIVE
Comment: NEGATIVE
Comment: NEGATIVE
Comment: NORMAL
Diagnosis: NEGATIVE
Neisseria Gonorrhea: NEGATIVE
Trichomonas: NEGATIVE

## 2021-07-31 ENCOUNTER — Other Ambulatory Visit: Payer: Self-pay | Admitting: Family Medicine

## 2021-07-31 DIAGNOSIS — Z3009 Encounter for other general counseling and advice on contraception: Secondary | ICD-10-CM

## 2021-07-31 NOTE — Progress Notes (Signed)
U preg order placed for pt's upcoming appt (per last note) ?

## 2021-08-01 ENCOUNTER — Ambulatory Visit: Payer: No Typology Code available for payment source

## 2021-08-01 ENCOUNTER — Other Ambulatory Visit: Payer: Self-pay | Admitting: Registered Nurse

## 2021-08-01 ENCOUNTER — Other Ambulatory Visit (INDEPENDENT_AMBULATORY_CARE_PROVIDER_SITE_OTHER): Payer: No Typology Code available for payment source

## 2021-08-01 DIAGNOSIS — Z1329 Encounter for screening for other suspected endocrine disorder: Secondary | ICD-10-CM

## 2021-08-01 DIAGNOSIS — Z113 Encounter for screening for infections with a predominantly sexual mode of transmission: Secondary | ICD-10-CM

## 2021-08-01 DIAGNOSIS — Z13228 Encounter for screening for other metabolic disorders: Secondary | ICD-10-CM

## 2021-08-01 DIAGNOSIS — Z130 Encounter for screening for diseases of the blood and blood-forming organs and certain disorders involving the immune mechanism: Secondary | ICD-10-CM

## 2021-08-01 LAB — CBC
HCT: 39.7 % (ref 36.0–46.0)
Hemoglobin: 13.4 g/dL (ref 12.0–15.0)
MCHC: 33.8 g/dL (ref 30.0–36.0)
MCV: 85.7 fl (ref 78.0–100.0)
Platelets: 255 10*3/uL (ref 150.0–400.0)
RBC: 4.64 Mil/uL (ref 3.87–5.11)
RDW: 13.3 % (ref 11.5–15.5)
WBC: 5.3 10*3/uL (ref 4.0–10.5)

## 2021-08-01 LAB — TSH: TSH: 1.47 u[IU]/mL (ref 0.35–5.50)

## 2021-08-02 LAB — RPR: RPR Ser Ql: NONREACTIVE

## 2021-08-02 LAB — HIV ANTIBODY (ROUTINE TESTING W REFLEX): HIV 1&2 Ab, 4th Generation: NONREACTIVE

## 2021-09-17 ENCOUNTER — Encounter: Payer: Self-pay | Admitting: Advanced Practice Midwife

## 2021-09-17 ENCOUNTER — Ambulatory Visit (INDEPENDENT_AMBULATORY_CARE_PROVIDER_SITE_OTHER): Payer: PRIVATE HEALTH INSURANCE | Admitting: Certified Nurse Midwife

## 2021-09-17 ENCOUNTER — Other Ambulatory Visit (HOSPITAL_COMMUNITY)
Admission: RE | Admit: 2021-09-17 | Discharge: 2021-09-17 | Disposition: A | Discharge status: Home / Self Care | Payer: PRIVATE HEALTH INSURANCE | Source: Ambulatory Visit | Attending: Advanced Practice Midwife | Admitting: Advanced Practice Midwife

## 2021-09-17 VITALS — BP 135/88 | HR 87 | Ht 59.0 in | Wt 123.5 lb

## 2021-09-17 DIAGNOSIS — Z30017 Encounter for initial prescription of implantable subdermal contraceptive: Secondary | ICD-10-CM | POA: Diagnosis not present

## 2021-09-17 DIAGNOSIS — R21 Rash and other nonspecific skin eruption: Secondary | ICD-10-CM | POA: Diagnosis not present

## 2021-09-17 DIAGNOSIS — L539 Erythematous condition, unspecified: Secondary | ICD-10-CM

## 2021-09-17 DIAGNOSIS — Z01419 Encounter for gynecological examination (general) (routine) without abnormal findings: Secondary | ICD-10-CM | POA: Diagnosis not present

## 2021-09-17 MED ORDER — ETONOGESTREL 68 MG ~~LOC~~ IMPL
68.0000 mg | DRUG_IMPLANT | Freq: Once | SUBCUTANEOUS | Status: AC
  Administered 2021-09-17: 68 mg via SUBCUTANEOUS

## 2021-09-17 NOTE — Progress Notes (Signed)
NGYN pt in office to establish care and for Nexplanon insertion. Pt unsure if she needs STD testing today and has concerns about prediabetic symptoms.  ?

## 2021-09-17 NOTE — Progress Notes (Signed)
? ?GYNECOLOGY PROGRESS NOTE ? ?History:  ?26 y.o. No obstetric history on file. presents to [facility] office today for problem gyn visit and insertion of Nexplanon implant. She reports 1 pea-sized red spot on right breast and 2 spots on left breast that appeared on 09/16/21. She denies tenderness and itching. Concerned that they may be "bug bites". She also endorses a new change in detergent. She denies swelling, h/a, dizziness, shortness of breath, n/v, or fever/chills. Pt denies hx of breast and cervical  cancers due to being adopted.  ? ?Previously experiencing Nausea with oral meds. PCP switched to depo for time-being, but desires nexplanon. She denies No headaches, and smoking. LMP August 20, 2021. Pt currently up to date with Depo, last injection 2 months ago. Endorses some spotting since depo use, but desires convenience of Nexplanon. Pt denies any new sexual partners. 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  ? ?The following portions of the patient's history were reviewed and updated as appropriate: allergies, current medications, past family history, past medical history, past social history, past surgical history and problem list. Last pap smear on 07/25/2021 was normal, Negative HRHPV. ? ?Health Maintenance Due  ?Topic Date Due  ? Hepatitis C Screening  Never done  ? COVID-19 Vaccine (4 - Booster for Pfizer series) 06/09/2020  ?  ? ?Review of Systems:  ?Pertinent items are noted in HPI. ?  ?Objective:  ?Physical Exam ?Blood pressure 135/88, pulse 87, height 4\' 11"  (1.499 m), weight 123 lb 8 oz (56 kg). ?VS reviewed, nursing note reviewed,  ?Constitutional: well developed, well nourished, no distress ?HEENT: normocephalic ?CV: normal rate ?Pulm/chest wall: normal effort ?Breast Exam: 1-1cm reddened, flat area noted on RUQ of Right breast near axilla. No nodules, or bumps noted. Non-tender to palpation. 2, 1-2 cm reddened flat areas noted on LRQ of Left breast. No nodules  noted. Non tender to palpation. No puncture marked viewed. Non-consistent with bug bites. Non-consistent with rash/hives. Manual Breast exam otherwise normal.     ?Abdomen: soft ?Neuro: alert and oriented x 3 ?Skin: warm, dry ?Psych: affect normal ?Pelvic exam: Deferred per pt request. Last Pap Normal Next Due 2026. ?Bimanual exam: deferred. ? ?Nexplanon Insertion Procedure ?Patient was given informed consent, she signed consent form.  Patient does understand that irregular bleeding is a very common side effect of this medication. She was advised to have backup contraception for one week after placement. Pregnancy test in clinic today was negative.  Appropriate time out taken.  Patient's left arm was prepped and draped in the usual sterile fashion.. The ruler used to measure and mark insertion area.  Patient was prepped with alcohol swab and then injected with 3 ml of 1% lidocaine.  She was prepped with betadine, Nexplanon removed from packaging,  Device confirmed in needle, then inserted full length of needle and withdrawn per handbook instructions. Nexplanon was able to palpated in the patient's arm; patient palpated the insert herself. There was minimal blood loss.  Patient insertion site covered with guaze and a pressure bandage to reduce any bruising.  The patient tolerated the procedure well and was given post procedure instructions ? ?Assessment & Plan:  ?1. Nexplanon insertion ?- Nexplanon inserted into Pt left Arm without difficulty. ?- Education provided on expectations of bruising and noted to return with any signs of infection.  ?- Please see RN note for Lot# and Exp. Date.  ?  ?2. Rash and nonspecific skin eruption ?- Recommended  to continue to watch for signs of spreading.  ?- Encouraged to hand wash bras in mild soap and hang to dry.  ?- Recommended OTC benadryl if develops more rash like symptoms. Encouraged to follow up with [place] or PCP if symptoms worsen. ?  ?3. Breast erythema ?  ? ?4. Women's  Annual routine Gyn EXAM  ?- Last pap smear was on 07/25/21 and was normal.  ?- Next Due 2026. ? ?Return in about 1 year (around 09/18/2022) for Annual Exam.  ? ?Claudette Head, CNM ?12:51 PM  ?

## 2021-09-17 NOTE — Progress Notes (Deleted)
? ?  GYNECOLOGY PROGRESS NOTE ? ?History:  ?26 y.o. No obstetric history on file. presents to [facility] *** office today for problem gyn visit. She reports *****.  She denies h/a, dizziness, shortness of breath, n/v, or fever/chills.   ? ?The following portions of the patient's history were reviewed and updated as appropriate: allergies, current medications, past family history, past medical history, past social history, past surgical history and problem list. Last pap smear on *** was normal, *** HRHPV. ? ?Health Maintenance Due  ?Topic Date Due  ? Hepatitis C Screening  Never done  ? COVID-19 Vaccine (4 - Booster for Pfizer series) 06/09/2020  ?  ? ?Review of Systems:  ?Pertinent items are noted in HPI. ?  ?Objective:  ?Physical Exam ?There were no vitals taken for this visit. ?VS reviewed, nursing note reviewed,  ?Constitutional: well developed, well nourished, no distress ?HEENT: normocephalic ?CV: normal rate ?Pulm/chest wall: normal effort ?Breast Exam: deferred ?Abdomen: soft ?Neuro: alert and oriented x 3 ?Skin: warm, dry ?Psych: affect normal ?Pelvic exam: Cervix pink, visually closed, without lesion, scant white creamy discharge, vaginal walls and external genitalia normal ?Bimanual exam: Cervix 0/long/high, firm, anterior, neg CMT, uterus nontender, nonenlarged, adnexa without tenderness, enlargement, or mass ? ?Assessment & Plan:  ?There are no diagnoses linked to this encounter. ? ?No follow-ups on file.  ? ?Sharen Counter, CNM ?8:10 AM  ?

## 2021-09-18 LAB — CERVICOVAGINAL ANCILLARY ONLY
Chlamydia: NEGATIVE
Comment: NEGATIVE
Comment: NEGATIVE
Comment: NORMAL
Neisseria Gonorrhea: NEGATIVE
Trichomonas: NEGATIVE

## 2022-02-25 ENCOUNTER — Ambulatory Visit (INDEPENDENT_AMBULATORY_CARE_PROVIDER_SITE_OTHER): Payer: PRIVATE HEALTH INSURANCE | Admitting: Family Medicine

## 2022-02-25 ENCOUNTER — Encounter: Payer: Self-pay | Admitting: Family Medicine

## 2022-02-25 VITALS — BP 116/68 | HR 109 | Temp 99.1°F | Resp 16 | Ht 59.0 in | Wt 125.4 lb

## 2022-02-25 DIAGNOSIS — Z23 Encounter for immunization: Secondary | ICD-10-CM

## 2022-02-25 DIAGNOSIS — Z0000 Encounter for general adult medical examination without abnormal findings: Secondary | ICD-10-CM | POA: Diagnosis not present

## 2022-02-25 DIAGNOSIS — E663 Overweight: Secondary | ICD-10-CM

## 2022-02-25 DIAGNOSIS — Z111 Encounter for screening for respiratory tuberculosis: Secondary | ICD-10-CM | POA: Diagnosis not present

## 2022-02-25 LAB — CBC WITH DIFFERENTIAL/PLATELET
Basophils Absolute: 0 10*3/uL (ref 0.0–0.1)
Basophils Relative: 0.6 % (ref 0.0–3.0)
Eosinophils Absolute: 0 10*3/uL (ref 0.0–0.7)
Eosinophils Relative: 0.3 % (ref 0.0–5.0)
HCT: 41.8 % (ref 36.0–46.0)
Hemoglobin: 14.1 g/dL (ref 12.0–15.0)
Lymphocytes Relative: 23.5 % (ref 12.0–46.0)
Lymphs Abs: 1.5 10*3/uL (ref 0.7–4.0)
MCHC: 33.7 g/dL (ref 30.0–36.0)
MCV: 84.9 fl (ref 78.0–100.0)
Monocytes Absolute: 0.3 10*3/uL (ref 0.1–1.0)
Monocytes Relative: 4.3 % (ref 3.0–12.0)
Neutro Abs: 4.7 10*3/uL (ref 1.4–7.7)
Neutrophils Relative %: 71.3 % (ref 43.0–77.0)
Platelets: 252 10*3/uL (ref 150.0–400.0)
RBC: 4.93 Mil/uL (ref 3.87–5.11)
RDW: 13.5 % (ref 11.5–15.5)
WBC: 6.5 10*3/uL (ref 4.0–10.5)

## 2022-02-25 NOTE — Patient Instructions (Addendum)
Follow up in 1 year or as needed We'll notify you of your lab results and make any changes if needed Keep up the good work on healthy diet and regular exercise- you look great!!! Call with any questions or concerns Stay Safe!  Stay Healthy! Happy Belated Birthday!!! 

## 2022-02-25 NOTE — Progress Notes (Signed)
   Subjective:    Patient ID: Carolyn Cummings, female    DOB: 13-Oct-1995, 26 y.o.   MRN: [id number]  HPI CPE- UTD on pap, Tdap.  Due for flu.  Enrolling in online nursing school.  No concerns today.  Not smoking or vaping.  Rare alcohol.  No drug use.  Currently in a relationship and feels safe  Health Maintenance  Topic Date Due   INFLUENZA VACCINE  12/04/2021   PAP-Cervical Cytology Screening  07/25/2024   PAP SMEAR-Modifier  07/25/2024   TETANUS/TDAP  07/07/2028   HPV VACCINES  Completed   HIV Screening  Completed   COVID-19 Vaccine  Discontinued   Hepatitis C Screening  Discontinued     Review of Systems Patient reports no vision/ hearing changes, adenopathy,fever, weight change,  persistant/recurrent hoarseness , swallowing issues, chest pain, palpitations, edema, persistant/recurrent cough, hemoptysis, dyspnea (rest/exertional/paroxysmal nocturnal), gastrointestinal bleeding (melena, rectal bleeding), abdominal pain, significant heartburn, bowel changes, GU symptoms (dysuria, hematuria, incontinence), Gyn symptoms (abnormal  bleeding, pain),  syncope, focal weakness, memory loss, numbness & tingling, skin/hair/nail changes, abnormal bruising or bleeding, anxiety, or depression.     Objective:   Physical Exam General Appearance:    Alert, cooperative, no distress, appears stated age  Head:    Normocephalic, without obvious abnormality, atraumatic  Eyes:    PERRL, conjunctiva/corneas clear, EOM's intact both eyes  Ears:    Normal TM's and external ear canals, both ears  Nose:   Nares normal, septum midline, mucosa normal, no drainage    or sinus tenderness  Throat:   Lips, mucosa, and tongue normal; teeth and gums normal  Neck:   Supple, symmetrical, trachea midline, no adenopathy;    Thyroid: no enlargement/tenderness/nodules  Back:     Symmetric, no curvature, ROM normal, no CVA tenderness  Lungs:     Clear to auscultation bilaterally, respirations unlabored  Chest Wall:     No tenderness or deformity   Heart:    Regular rate and rhythm, S1 and S2 normal, no murmur, rub   or gallop  Breast Exam:    Deferred to GYN  Abdomen:     Soft, non-tender, bowel sounds active all four quadrants,    no masses, no organomegaly  Genitalia:    Deferred to GYN  Rectal:    Extremities:   Extremities normal, atraumatic, no cyanosis or edema  Pulses:   2+ and symmetric all extremities  Skin:   Skin color, texture, turgor normal, no rashes or lesions  Lymph nodes:   Cervical, supraclavicular, and axillary nodes normal  Neurologic:   CNII-XII intact, normal strength, sensation and reflexes    throughout          Assessment & Plan:

## 2022-02-25 NOTE — Assessment & Plan Note (Signed)
Pt's PE WNL.  UTD on Tdap, pap.  Flu shot given today.  Check labs.  Anticipatory guidance provided.

## 2022-02-26 LAB — LIPID PANEL
Cholesterol: 188 mg/dL (ref 0–200)
HDL: 74.9 mg/dL (ref >39.00)
LDL Cholesterol: 84 mg/dL (ref 0–99)
NonHDL: 113.55
Total CHOL/HDL Ratio: 3
Triglycerides: 149 mg/dL (ref 0.0–149.0)
VLDL: 29.8 mg/dL (ref 0.0–40.0)

## 2022-02-26 LAB — BASIC METABOLIC PANEL
BUN: 12 mg/dL (ref 6–23)
CO2: 28 mEq/L (ref 19–32)
Calcium: 9 mg/dL (ref 8.4–10.5)
Chloride: 103 mEq/L (ref 96–112)
Creatinine, Ser: 1 mg/dL (ref 0.40–1.20)
GFR: 78.57 mL/min (ref >60.00)
Glucose, Bld: 92 mg/dL (ref 70–99)
Potassium: 4 mEq/L (ref 3.5–5.1)
Sodium: 139 mEq/L (ref 135–145)

## 2022-02-26 LAB — HEPATIC FUNCTION PANEL
ALT: 14 U/L (ref 0–35)
AST: 18 U/L (ref 0–37)
Albumin: 4.5 g/dL (ref 3.5–5.2)
Alkaline Phosphatase: 61 U/L (ref 39–117)
Bilirubin, Direct: 0 mg/dL (ref 0.0–0.3)
Total Bilirubin: 0.5 mg/dL (ref 0.2–1.2)
Total Protein: 7.1 g/dL (ref 6.0–8.3)

## 2022-02-26 LAB — VITAMIN D 25 HYDROXY (VIT D DEFICIENCY, FRACTURES): VITD: 15.78 ng/mL — ABNORMAL LOW (ref 30.00–100.00)

## 2022-02-26 LAB — TSH: TSH: 0.9 u[IU]/mL (ref 0.35–5.50)

## 2022-02-27 ENCOUNTER — Other Ambulatory Visit: Payer: Self-pay

## 2022-02-27 DIAGNOSIS — R7989 Other specified abnormal findings of blood chemistry: Secondary | ICD-10-CM

## 2022-02-27 MED ORDER — VITAMIN D (ERGOCALCIFEROL) 1.25 MG (50000 UNIT) PO CAPS: 50000.0000 [IU] | ORAL_CAPSULE | ORAL | 12 refills | Status: DC

## 2022-02-27 NOTE — Progress Notes (Signed)
Informed pt of lab results . Sent in Vit D 50,000 units to pharmacy

## 2022-03-02 LAB — QUANTIFERON-TB GOLD PLUS
Mitogen-NIL: >10 IU/mL
NIL: 0.12 IU/mL
QuantiFERON-TB Gold Plus: NEGATIVE
TB1-NIL: <0 IU/mL
TB2-NIL: <0 IU/mL

## 2022-03-04 ENCOUNTER — Telehealth: Payer: Self-pay

## 2022-03-04 NOTE — Telephone Encounter (Signed)
-----   Message from Midge Minium, MD sent at 03/04/2022  7:31 AM EDT ----- Negative TB test- great news!  Forms completed and available for pick up

## 2022-03-04 NOTE — Telephone Encounter (Signed)
Left VM stating lab result and that her form has been placed up front in file cabinet for pickup . Copy placed in scan

## 2022-08-04 ENCOUNTER — Encounter: Payer: Self-pay | Admitting: Family Medicine

## 2022-08-12 ENCOUNTER — Encounter: Payer: Self-pay | Admitting: Family Medicine

## 2022-08-12 ENCOUNTER — Ambulatory Visit: Payer: PRIVATE HEALTH INSURANCE | Admitting: Family Medicine

## 2022-08-12 VITALS — BP 110/78 | HR 79 | Temp 98.4°F | Resp 16 | Ht 59.0 in | Wt 121.1 lb

## 2022-08-12 DIAGNOSIS — K625 Hemorrhage of anus and rectum: Secondary | ICD-10-CM

## 2022-08-12 DIAGNOSIS — R194 Change in bowel habit: Secondary | ICD-10-CM | POA: Diagnosis not present

## 2022-08-12 NOTE — Progress Notes (Signed)
   Subjective:    Patient ID: Carolyn Cummings, female    DOB: 10-Jan-1996, 27 y.o.   MRN: [id number]  HPI Bowel changes- 2 weeks ago had watery diarrhea x2 days.  Pt reports increased frequency of BM's x1 year.  Diet did change when she moved out of parents' house (mom is nutritionist)  If she has to wipe frequently, it becomes painful and will have blood on toilet tissue.  This has been twice- last occurred a week ago.  Has not felt any lumps or bumps when wiping.  No blood w/ light wiping, blood w/ aggressive or repeated wiping.  No abd pain.  No N/V.     Review of Systems For ROS see HPI     Objective:   Physical Exam Vitals reviewed.  Constitutional:      General: She is not in acute distress.    Appearance: She is well-developed. She is not ill-appearing.  HENT:     Head: Normocephalic and atraumatic.  Cardiovascular:     Rate and Rhythm: Normal rate.  Pulmonary:     Effort: Pulmonary effort is normal. No respiratory distress.  Abdominal:     General: There is no distension.     Palpations: Abdomen is soft.     Tenderness: There is no abdominal tenderness.  Genitourinary:    Rectum: Normal. No mass or tenderness.  Skin:    General: Skin is warm and dry.  Neurological:     General: No focal deficit present.     Mental Status: She is alert.  Psychiatric:        Mood and Affect: Mood is anxious.        Behavior: Behavior normal.           Assessment & Plan:  BRBPR/Change in bowel habits- new.  Pt reports she has had changes in the frequency of her bowel movements over that last year.  This may correlate w/ dietary changes as she has moved out of her parents house in that same time frame.  She reports when she wipes excessively or aggressively she will have blood on the toilet tissue.  Has not seen blood in the bowl.  Discussed that this is likely a small tear from wiping.  No abnormalities seen on rectal exam.  She is anxious b/c she works at a [employer] and is  fearful regarding the data of younger onset colon cancer.  Will refer to GI for complete evaluation and attempted to provide reassurance today.  Will follow.

## 2022-08-12 NOTE — Patient Instructions (Addendum)
Follow up as needed or as scheduled We'll call you to schedule your GI appt Try to not be as aggressive with wiping or use flushable wet wipes Monitor your diet and see if there is any correlation between fatty foods/restaurant foods and increased bowel movements Thankfully on exam today there was nothing abnormal- great news! Call with any questions or concerns Happy Spring!!

## 2022-08-27 ENCOUNTER — Encounter: Payer: Self-pay | Admitting: Nurse Practitioner

## 2022-10-28 ENCOUNTER — Ambulatory Visit: Payer: PRIVATE HEALTH INSURANCE | Admitting: Nurse Practitioner

## 2022-11-05 ENCOUNTER — Telehealth: Payer: Self-pay

## 2022-11-05 NOTE — Transitions of Care (Post Inpatient/ED Visit) (Signed)
   11/05/2022  Name: Carolyn Cummings MRN: [id number] DOB: 1995-06-01  Today's TOC FU Call Status: Today's TOC FU Call Status:: Successful TOC FU Call Competed TOC FU Call Complete Date: 11/05/22  Transition Care Management Follow-up Telephone Call Date of Discharge: 11/05/22 Discharge Facility: Other ([facility]) Name of Other ([facility]) [facility]charge Facility: [facility] Type of Discharge: Emergency Department Reason for ED Visit: Other: How have you been since you were released from the hospital?: Better (Patient states she feels a little better with the medication she was given but still has some pain/discomfort. Transitions slowly and avoids sudden movements to help.) Any questions or concerns?: No  Items Reviewed: Did you receive and understand the discharge instructions provided?: Yes Medications obtained,verified, and reconciled?: Yes (Medications Reviewed) Any new allergies since your discharge?: No Dietary orders reviewed?: No Do you have support at home?: Yes People in Home: alone Name of Support/Comfort Primary Source: Lives alone but parents live close and can help  Medications Reviewed Today: Medications Reviewed Today     Reviewed by Lenard Simmer, CMA (Certified Medical Assistant) on 11/05/22 at 1242  Med List Status: <None>   Medication Order Taking? Sig Documenting Provider Last Dose Status Informant  cyclobenzaprine (FLEXERIL) 5 MG tablet [id number] Yes Take by mouth. [provider] Taking Active   EPIDUO FORTE 0.3-2.5 % GEL [id number] Yes  [provider] Taking Active            Med Note Tyna Jaksch, JESSICA L   Tue Apr 04, 2015 10:21 AM) Received from: External Pharmacy  etonogestrel (NEXPLANON) 68 MG IMPL implant [id number] Yes 1 each by Subdermal route once. [provider] Taking Active   naproxen (NAPROSYN) 500 MG tablet [id number] Yes Take by mouth. [provider] Taking Active   omeprazole (PRILOSEC) 20 MG capsule  [id number] Yes TAKE 1 CAPSULE DAILY Sheliah Hatch, MD Taking Active   Vitamin D, Ergocalciferol, (DRISDOL) 1.25 MG (50000 UNIT) CAPS capsule [id number] Yes Take 1 capsule (50,000 Units total) by mouth every 7 (seven) days. Sheliah Hatch, MD Taking Active             Home Care and Equipment/Supplies: Were Home Health Services Ordered?: No Any new equipment or medical supplies ordered?: No  Functional Questionnaire: Do you need assistance with bathing/showering or dressing?: No Do you need assistance with meal preparation?: No Do you need assistance with eating?: No Do you have difficulty maintaining continence: No Do you need assistance with getting out of bed/getting out of a chair/moving?: No Do you have difficulty managing or taking your medications?: No  Follow up appointments reviewed: PCP Follow-up appointment confirmed?: No (Patient states she doesn't feel like she needs it at this time but will call if she does) MD Provider Line [phone] Given: Yes Specialist Hospital Follow-up appointment confirmed?: No Do you need transportation to your follow-up appointment?: No Do you understand care options if your condition(s) worsen?: Yes-patient verbalized understanding    SIGNATURE Lenard Simmer, CMA

## 2023-03-27 ENCOUNTER — Telehealth: Payer: Self-pay | Admitting: Family Medicine

## 2023-03-27 MED ORDER — FLUCONAZOLE 150 MG PO TABS: 150.0000 mg | ORAL_TABLET | Freq: Once | ORAL | 0 refills | Status: AC

## 2023-03-27 NOTE — Telephone Encounter (Signed)
Left vm to inform pt rx has been sent in.

## 2023-03-27 NOTE — Telephone Encounter (Signed)
Please advise 

## 2023-03-27 NOTE — Telephone Encounter (Signed)
Caller name: Nikina Hayer  On DPR?: Yes  Call back number: [phone] (mobile)  Provider they see: Sheliah Hatch, MD  Reason for call:  Pt was prescribed Amoxicillin for strep about a month ago. Pt was could not remember to take every dose. She then suffered from food poisoning. She was prescribed Cefdinir 300mg  BID x 10 days to help get rid of the strep. Now she thinks she got a yeast infection from all of the antibiotics and the amount of time she was taking them. She was wanting to know if a prescription could be called in for this infection. I see there is an encounter where she was seen in our urgent care in [place] on 10/27 and again on 11/13.

## 2023-03-27 NOTE — Telephone Encounter (Signed)
Prescription sent for Diflucan as requested

## 2023-03-31 ENCOUNTER — Encounter: Payer: Self-pay | Admitting: Family Medicine

## 2023-03-31 ENCOUNTER — Ambulatory Visit: Payer: PRIVATE HEALTH INSURANCE | Admitting: Family Medicine

## 2023-03-31 VITALS — BP 112/68 | HR 91 | Temp 98.3°F | Ht 59.0 in | Wt 120.1 lb

## 2023-03-31 DIAGNOSIS — J029 Acute pharyngitis, unspecified: Secondary | ICD-10-CM

## 2023-03-31 LAB — POCT RAPID STREP A (OFFICE): Rapid Strep A Screen: NEGATIVE

## 2023-03-31 NOTE — Progress Notes (Signed)
   Subjective:    Patient ID: Carolyn Cummings, female    DOB: [birth year], 27 y.o.   MRN: [id number]  HPI Recurrent strep- seen 10/27 and started on Augmentin.  Was seen 11/13 and treated w/ Omnicef x10 days.  Pt admits that she stopped first round of abx once she started feeling better.  Just finished 2nd round.  Pt reports throat did gradually improve.  Feels that throat is again becoming sore.     Review of Systems For ROS see HPI     Objective:   Physical Exam Vitals reviewed.  Constitutional:      General: She is not in acute distress.    Appearance: She is well-developed. She is not ill-appearing.  HENT:     Head: Normocephalic and atraumatic.     Nose: No congestion.     Mouth/Throat:     Mouth: Mucous membranes are moist.     Pharynx: Posterior oropharyngeal erythema (copious PND) present.     Tonsils: No tonsillar exudate. 1+ on the right. 1+ on the left.  Eyes:     Conjunctiva/sclera: Conjunctivae normal.  Musculoskeletal:     Cervical back: Normal range of motion and neck supple.  Lymphadenopathy:     Cervical: No cervical adenopathy.  Skin:    General: Skin is warm and dry.     Findings: No rash.  Neurological:     General: No focal deficit present.     Mental Status: She is alert and oriented to person, place, and time.  Psychiatric:        Mood and Affect: Mood normal.        Behavior: Behavior normal.           Assessment & Plan:  Pharyngitis- new.  Pt recently had 2 rounds of abx for back to back strep infxns.  This is a little misleading as pt did not complete her first round of abx and stopped medication once she felt better.  Suspect this did not eradicate her infxn and this recurred.  Was started on Omnicef and this time she completed course.  Today strep test is negative and throat appears normal w/ exception of copious PND and mild residual tonsil swelling.  Encouraged pt to take daily antihistamine to help w/ PND and drink lots of fluids.  If  repeat infxn in short time, will refer to ENT.  Pt expressed understanding and is in agreement w/ plan.

## 2023-03-31 NOTE — Patient Instructions (Signed)
Follow up as needed or as scheduled START a daily Claritin or Zyrtec to decrease post nasal drip Drink LOTS of fluids Tylenol or ibuprofen as needed for pain and swelling IF you develop worsening sore throat, fever, or other concerns- let me know! Call with any questions or concerns Hang in there! Happy Thanksgiving!!

## 2023-05-26 ENCOUNTER — Ambulatory Visit: Payer: Self-pay | Admitting: Family Medicine

## 2023-05-26 NOTE — Telephone Encounter (Signed)
  Chief Complaint: urgency voiding Symptoms: urgency Frequency: started 1/10 Pertinent Negatives: Patient denies fever, blood in urine, odor,   Disposition: [] ED /[] Urgent Care (no appt availability in office) / [x] Appointment(In office/virtual)/ []  [facility]/ [] Home Care/ [] Refused Recommended Disposition /[] [facility]/ []  Follow-up with PCP  Additional Notes:  hx of numerous UTIs. Usually has an order for abx as she has frequent UTIs. Pt states that she has been seen in UC and her employee health, given bactrim and pyridium, no relief. Has used bactrim and macrobid with no relief. Pt does note the macrobid was expired by 4 years. Pt prefers [facility] if PCP is willing to write for the rx.   Copied from CRM [id number]. Topic: Clinical - Red Word Triage >> May 26, 2023 11:39 AM Marica Otter wrote: [facility] that prompted transfer to Nurse Triage: Patient has UTI having burning, pressure, and urgency. Reason for Disposition  Urinating more frequently than usual (i.e., frequency)  Answer Assessment - Initial Assessment Questions 1. SYMPTOM: "What's the main symptom you're concerned about?" (e.g., frequency, incontinence)     urgency 2. ONSET: "When did the  urgency  start?"     1/10 3. PAIN: "Is there any pain?" If Yes, ask: "How bad is it?" (Scale: 1-10; mild, moderate, severe)     Denies back pain, just very uncomfortable, 6-woke me up from my sleep 4. CAUSE: "What do you think is causing the symptoms?"     UTI 5. OTHER SYMPTOMS: "Do you have any other symptoms?" (e.g., blood in urine, fever, flank pain, pain with urination)     Burning, pressure,  6. PREGNANCY: "Is there any chance you are pregnant?" "When was your last menstrual period?"     On period currently, denies preg  Protocols used: Urinary Symptoms-A-AH

## 2023-05-26 NOTE — Telephone Encounter (Signed)
Pt needs an appointment we cannot write without urine sample testing

## 2023-05-26 NOTE — Telephone Encounter (Signed)
Called pt she will call back after tomorrow after schedule comes out. 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

## 2023-05-27 ENCOUNTER — Ambulatory Visit: Payer: PRIVATE HEALTH INSURANCE | Admitting: Family Medicine

## 2023-05-27 ENCOUNTER — Encounter: Payer: Self-pay | Admitting: Family Medicine

## 2023-05-27 VITALS — BP 122/76 | HR 87 | Temp 97.8°F | Wt 124.0 lb

## 2023-05-27 DIAGNOSIS — R3915 Urgency of urination: Secondary | ICD-10-CM

## 2023-05-27 DIAGNOSIS — N921 Excessive and frequent menstruation with irregular cycle: Secondary | ICD-10-CM | POA: Diagnosis not present

## 2023-05-27 DIAGNOSIS — R300 Dysuria: Secondary | ICD-10-CM | POA: Diagnosis not present

## 2023-05-27 DIAGNOSIS — R102 Pelvic and perineal pain: Secondary | ICD-10-CM

## 2023-05-27 LAB — POC URINALSYSI DIPSTICK (AUTOMATED)
Bilirubin, UA: NEGATIVE
Blood, UA: NEGATIVE
Glucose, UA: NEGATIVE
Ketones, UA: NEGATIVE
Leukocytes, UA: NEGATIVE
Nitrite, UA: NEGATIVE
Protein, UA: NEGATIVE
Spec Grav, UA: 1.01 (ref 1.010–1.025)
Urobilinogen, UA: 0.2 U/dL
pH, UA: 6 (ref 5.0–8.0)

## 2023-05-27 MED ORDER — METRONIDAZOLE 500 MG PO TABS: 500.0000 mg | ORAL_TABLET | Freq: Two times a day (BID) | ORAL | 0 refills | Status: DC

## 2023-05-27 NOTE — Progress Notes (Signed)
Subjective:     Patient ID: Carolyn Cummings, female    DOB: [birth year], 28 y.o.   MRN: [id number]  Chief Complaint  Patient presents with   Dysuria    Was on cruise Jan 10th (hot tub) and that's when sx started. Was given macrobid for preventative but they were expired. Took them for 7 days on the cruise and still having "pressure" and urgency. Thursday [facility] and did [facility]A and it was negative, given bactrum for 3 days but still havin the same sx. Finished those Sunday and went to [facility]   Urinary Frequency    HPI   History of Present Illness         C/o dysuria, urgency and frequency since 05/16/2023 after being on a cruise and sitting in a hot tub and wearing a wet bathing suit most of the day.   States she took expired Macrobid x 7 days with very little relief.  She went to urgent care and had testing: UA, GC, and culture all negative.  Prescribed a 3 day course of Bactrim without relief.   States she was not tested for BV because she is currently on her period.  She went back to UC yesterday and was prescribed Pyridium.   Denies hx of recurrent UTI or pyelonephritis.   She is a Engineer, civil (consulting).    Health Maintenance Due  Topic Date Due   INFLUENZA VACCINE  12/05/2022    Past Medical History:  Diagnosis Date   Acne     Past Surgical History:  Procedure Laterality Date   WISDOM TOOTH EXTRACTION      Family History  Adopted: Yes    Social History   Socioeconomic History   Marital status: Single    Spouse name: Not on file   Number of children: Not on file   Years of education: Not on file   Highest education level: Bachelor's degree (e.g., BA, AB, BS)  Occupational History   Not on file  Tobacco Use   Smoking status: Never   Smokeless tobacco: Never  Vaping Use   Vaping status: Never Used  Substance and Sexual Activity   Alcohol use: No   Drug use: No   Sexual activity: Yes    Birth control/protection: Injection  Other Topics Concern   Not  on file  Social History Narrative   Not on file   Social Drivers of Health   Financial Resource Strain: Low Risk  (05/26/2023)   Overall Financial Resource Strain (CARDIA)    Difficulty of Paying Living Expenses: Not hard at all  Food Insecurity: No Food Insecurity (05/26/2023)   Hunger Vital Sign    Worried About Running Out of Food in the Last Year: Never true    Ran Out of Food in the Last Year: Never true  Transportation Needs: No Transportation Needs (05/26/2023)   PRAPARE - Administrator, Civil Service (Medical): No    Lack of Transportation (Non-Medical): No  Physical Activity: Sufficiently Active (05/26/2023)   Exercise Vital Sign    Days of Exercise per Week: 4 days    Minutes of Exercise per Session: 60 min  Stress: No Stress Concern Present (05/26/2023)   [employer] of Occupational Health - Occupational Stress Questionnaire    Feeling of Stress : Only a little  Social Connections: Socially Isolated (05/26/2023)   Social Connection and Isolation Panel [NHANES]    Frequency of Communication with Friends and Family: More than three times a week  Frequency of Social Gatherings with Friends and Family: Three times a week    Attends Religious Services: Never    Active Member of Clubs or Organizations: No    Attends Engineer, structural: Not on file    Marital Status: Never married  Intimate Partner Violence: Not on file    Outpatient Medications Prior to Visit  Medication Sig Dispense Refill   EPIDUO FORTE 0.3-2.5 % GEL   0   etonogestrel (NEXPLANON) 68 MG IMPL implant 1 each by Subdermal route once.     phenazopyridine (PYRIDIUM) 200 MG tablet Take by mouth. (Patient not taking: Reported on 05/27/2023)     No facility-administered medications prior to visit.    No Known Allergies  Review of Systems  Constitutional:  Negative for chills and fever.  Respiratory:  Negative for shortness of breath.   Cardiovascular:  Negative for chest pain,  palpitations and leg swelling.  Gastrointestinal:  Negative for abdominal pain, constipation, diarrhea, nausea and vomiting.  Genitourinary:  Positive for dysuria, frequency and urgency. Negative for flank pain.  Musculoskeletal:  Negative for myalgias.  Neurological:  Negative for dizziness and focal weakness.       Objective:    Physical Exam Constitutional:      General: She is not in acute distress.    Appearance: She is not ill-appearing.  Eyes:     Extraocular Movements: Extraocular movements intact.     Conjunctiva/sclera: Conjunctivae normal.  Cardiovascular:     Rate and Rhythm: Normal rate.  Pulmonary:     Effort: Pulmonary effort is normal.  Abdominal:     General: There is no distension.     Palpations: Abdomen is soft.     Tenderness: There is no abdominal tenderness. There is no right CVA tenderness, left CVA tenderness or guarding.  Musculoskeletal:     Cervical back: Normal range of motion and neck supple.  Skin:    General: Skin is warm and dry.  Neurological:     General: No focal deficit present.     Mental Status: She is alert and oriented to person, place, and time.  Psychiatric:        Mood and Affect: Mood normal.        Behavior: Behavior normal.        Thought Content: Thought content normal.      BP 122/76 (BP Location: Left Arm, Patient Position: Sitting, Cuff Size: Normal)   Pulse 87   Temp 97.8 F (36.6 C) (Temporal)   Wt 124 lb (56.2 kg)   SpO2 99%   BMI 25.04 kg/m  Wt Readings from Last 3 Encounters:  05/27/23 124 lb (56.2 kg)  03/31/23 120 lb 2 oz (54.5 kg)  08/12/22 121 lb 2 oz (54.9 kg)       Assessment & Plan:   Problem List Items Addressed This Visit   None Visit Diagnoses       Dysuria    -  Primary   Relevant Orders   POCT Urinalysis Dipstick (Automated) (Completed)   Urine Culture     Prolonged menstrual cycle         Urinary urgency       Relevant Orders   Urine Culture     Pelvic pressure in female        Relevant Orders   Urine Culture      Reviewed notes and results from urgent care. No red flag symptoms. Well appearing.  UA negative today. Urine sent for culture.  Discussed possibility of BV but unable to test due to menses.  Decided to treat her with a course of Flagyl. Avoid alcohol.  Recommend follow up with OB/GYN and PCP.   I am having Dee Kamath start on metroNIDAZOLE. I am also having her maintain her Epiduo Forte, Nexplanon, and phenazopyridine.  Meds ordered this encounter  Medications   metroNIDAZOLE (FLAGYL) 500 MG tablet    Sig: Take 1 tablet (500 mg total) by mouth 2 (two) times daily.    Dispense:  14 tablet    Refill:  0    Supervising Provider:   Hillard Danker A [[id number]]

## 2023-05-27 NOTE — Telephone Encounter (Signed)
Patient has an appt with [facility] Blend at [facility] today.

## 2023-05-28 ENCOUNTER — Encounter: Payer: Self-pay | Admitting: Family Medicine

## 2023-05-28 LAB — URINE CULTURE: Result:: NO GROWTH

## 2023-06-06 ENCOUNTER — Encounter: Payer: Self-pay | Admitting: Family Medicine

## 2023-06-06 ENCOUNTER — Ambulatory Visit (INDEPENDENT_AMBULATORY_CARE_PROVIDER_SITE_OTHER): Payer: PRIVATE HEALTH INSURANCE | Admitting: Family Medicine

## 2023-06-06 VITALS — BP 120/64 | HR 74 | Temp 98.3°F | Ht 59.0 in | Wt 122.0 lb

## 2023-06-06 DIAGNOSIS — R7989 Other specified abnormal findings of blood chemistry: Secondary | ICD-10-CM

## 2023-06-06 DIAGNOSIS — Z0000 Encounter for general adult medical examination without abnormal findings: Secondary | ICD-10-CM | POA: Diagnosis not present

## 2023-06-06 DIAGNOSIS — R350 Frequency of micturition: Secondary | ICD-10-CM

## 2023-06-06 DIAGNOSIS — Z202 Contact with and (suspected) exposure to infections with a predominantly sexual mode of transmission: Secondary | ICD-10-CM | POA: Diagnosis not present

## 2023-06-06 DIAGNOSIS — E559 Vitamin D deficiency, unspecified: Secondary | ICD-10-CM | POA: Diagnosis not present

## 2023-06-06 LAB — BASIC METABOLIC PANEL
BUN: 13 mg/dL (ref 6–23)
CO2: 29 meq/L (ref 19–32)
Calcium: 9.2 mg/dL (ref 8.4–10.5)
Chloride: 102 meq/L (ref 96–112)
Creatinine, Ser: 0.85 mg/dL (ref 0.40–1.20)
GFR: 94.64 mL/min (ref >60.00)
Glucose, Bld: 85 mg/dL (ref 70–99)
Potassium: 4.3 meq/L (ref 3.5–5.1)
Sodium: 139 meq/L (ref 135–145)

## 2023-06-06 LAB — CBC WITH DIFFERENTIAL/PLATELET
Basophils Absolute: 0 10*3/uL (ref 0.0–0.1)
Basophils Relative: 0.5 % (ref 0.0–3.0)
Eosinophils Absolute: 0.1 10*3/uL (ref 0.0–0.7)
Eosinophils Relative: 1 % (ref 0.0–5.0)
HCT: 43 % (ref 36.0–46.0)
Hemoglobin: 14.3 g/dL (ref 12.0–15.0)
Lymphocytes Relative: 29.1 % (ref 12.0–46.0)
Lymphs Abs: 1.6 10*3/uL (ref 0.7–4.0)
MCHC: 33.2 g/dL (ref 30.0–36.0)
MCV: 87.2 fL (ref 78.0–100.0)
Monocytes Absolute: 0.4 10*3/uL (ref 0.1–1.0)
Monocytes Relative: 7.5 % (ref 3.0–12.0)
Neutro Abs: 3.4 10*3/uL (ref 1.4–7.7)
Neutrophils Relative %: 61.9 % (ref 43.0–77.0)
Platelets: 308 10*3/uL (ref 150.0–400.0)
RBC: 4.93 Mil/uL (ref 3.87–5.11)
RDW: 12.9 % (ref 11.5–15.5)
WBC: 5.5 10*3/uL (ref 4.0–10.5)

## 2023-06-06 LAB — POCT URINALYSIS DIPSTICK
Bilirubin, UA: NEGATIVE
Blood, UA: NEGATIVE
Glucose, UA: NEGATIVE
Ketones, UA: NEGATIVE
Leukocytes, UA: NEGATIVE
Nitrite, UA: NEGATIVE
Protein, UA: NEGATIVE
Spec Grav, UA: 1.01 (ref 1.010–1.025)
Urobilinogen, UA: 0.2 U/dL
pH, UA: 6 (ref 5.0–8.0)

## 2023-06-06 LAB — HEPATIC FUNCTION PANEL
ALT: 28 U/L (ref 0–35)
AST: 20 U/L (ref 0–37)
Albumin: 4.6 g/dL (ref 3.5–5.2)
Alkaline Phosphatase: 54 U/L (ref 39–117)
Bilirubin, Direct: 0.1 mg/dL (ref 0.0–0.3)
Total Bilirubin: 0.3 mg/dL (ref 0.2–1.2)
Total Protein: 6.9 g/dL (ref 6.0–8.3)

## 2023-06-06 LAB — TSH: TSH: 0.88 u[IU]/mL (ref 0.35–5.50)

## 2023-06-06 LAB — VITAMIN D 25 HYDROXY (VIT D DEFICIENCY, FRACTURES): VITD: 21.64 ng/mL — ABNORMAL LOW (ref 30.00–100.00)

## 2023-06-06 NOTE — Assessment & Plan Note (Signed)
Pt's PE WNL.  UTD on pap, Tdap.  Check labs.  Anticipatory guidance provided.  

## 2023-06-06 NOTE — Patient Instructions (Signed)
Schedule your complete physical in 1 year We'll notify you of your lab results and make any changes if needed Keep up the good work on healthy diet and regular exercise- you look great! Call with any questions or concerns Stay Safe!  Stay Healthy!

## 2023-06-06 NOTE — Addendum Note (Signed)
Addended by: Dorris Fetch on: 06/06/2023 10:16 AM   Modules accepted: Orders

## 2023-06-06 NOTE — Progress Notes (Signed)
   Subjective:    Patient ID: Carolyn Cummings, female    DOB: [birth year], 28 y.o.   MRN: [id number]  HPI CPE- UTD on pap, Tdap  Patient Care Team    Relationship Specialty Notifications Start End  Sheliah Hatch, MD PCP - General Family Medicine  04/04/15      Health Maintenance  Topic Date Due   Hepatitis C Screening  Never done   INFLUENZA VACCINE  12/05/2022   COVID-19 Vaccine (4 - 2024-25 season) 01/05/2023   Cervical Cancer Screening (Pap smear)  07/25/2024   DTaP/Tdap/Td (8 - Td or Tdap) 07/07/2028   HPV VACCINES  Completed   HIV Screening  Completed   Pneumococcal Vaccine 67-60 Years old  Aged Out     Review of Systems Patient reports no vision/ hearing changes, adenopathy,fever, weight change,  persistant/recurrent hoarseness , swallowing issues, chest pain, palpitations, edema, persistant/recurrent cough, hemoptysis, dyspnea (rest/exertional/paroxysmal nocturnal), gastrointestinal bleeding (melena, rectal bleeding), abdominal pain, significant heartburn, bowel changes, Gyn symptoms (abnormal  bleeding, pain),  syncope, focal weakness, memory loss, numbness & tingling, skin/hair/nail changes, abnormal bruising or bleeding, anxiety, or depression.   Has been seen 3x for urinary sxs.  Treated w/ abx, diflucan.  No obvious UTI.  Sxs have been going on for nearly a month.  Possibly better over the last 2 days.      Objective:   Physical Exam General Appearance:    Alert, cooperative, no distress, appears stated age  Head:    Normocephalic, without obvious abnormality, atraumatic  Eyes:    PERRL, conjunctiva/corneas clear, EOM's intact both eyes  Ears:    Normal TM's and external ear canals, both ears  Nose:   Nares normal, septum midline, mucosa normal, no drainage    or sinus tenderness  Throat:   Lips, mucosa, and tongue normal; teeth and gums normal  Neck:   Supple, symmetrical, trachea midline, no adenopathy;    Thyroid: no enlargement/tenderness/nodules  Back:      Symmetric, no curvature, ROM normal, no CVA tenderness  Lungs:     Clear to auscultation bilaterally, respirations unlabored  Chest Wall:    No tenderness or deformity   Heart:    Regular rate and rhythm, S1 and S2 normal, no murmur, rub   or gallop  Breast Exam:    Deferred to GYN  Abdomen:     Soft, non-tender, bowel sounds active all four quadrants,    no masses, no organomegaly  Genitalia:    Deferred to GYN  Rectal:    Extremities:   Extremities normal, atraumatic, no cyanosis or edema  Pulses:   2+ and symmetric all extremities  Skin:   Skin color, texture, turgor normal, no rashes or lesions  Lymph nodes:   Cervical, supraclavicular, and axillary nodes normal  Neurologic:   CNII-XII intact, normal strength, sensation and reflexes    throughout          Assessment & Plan:

## 2023-06-07 LAB — SURESWAB® ADVANCED VAGINITIS PLUS,TMA
C. trachomatis RNA, TMA: NOT DETECTED
CANDIDA SPECIES: NOT DETECTED
Candida glabrata: NOT DETECTED
N. gonorrhoeae RNA, TMA: NOT DETECTED
SURESWAB(R) ADV BACTERIAL VAGINOSIS(BV),TMA: NEGATIVE
TRICHOMONAS VAGINALIS (TV),TMA: NOT DETECTED

## 2023-06-07 LAB — HIV ANTIBODY (ROUTINE TESTING W REFLEX): HIV 1&2 Ab, 4th Generation: NONREACTIVE

## 2023-06-07 LAB — RPR: RPR Ser Ql: NONREACTIVE

## 2023-06-08 LAB — URINE CULTURE
MICRO NUMBER:: 16026058
Result:: NO GROWTH
SPECIMEN QUALITY:: ADEQUATE

## 2023-06-09 ENCOUNTER — Telehealth: Payer: Self-pay

## 2023-06-09 ENCOUNTER — Encounter: Payer: Self-pay | Admitting: Family Medicine

## 2023-06-09 ENCOUNTER — Other Ambulatory Visit: Payer: Self-pay

## 2023-06-09 DIAGNOSIS — E559 Vitamin D deficiency, unspecified: Secondary | ICD-10-CM

## 2023-06-09 MED ORDER — VITAMIN D (ERGOCALCIFEROL) 1.25 MG (50000 UNIT) PO CAPS: 50000.0000 [IU] | ORAL_CAPSULE | ORAL | 0 refills | Status: AC

## 2023-06-09 NOTE — Telephone Encounter (Signed)
-----   Message from Neena Rhymes sent at 06/09/2023  7:39 AM EST ----- Labs look great w/ exception of low Vit D.  Based on this, we need to start 50,000 units weekly x12 weeks in addition to daily OTC supplement of at least 2000 units.   Your urine culture was negative, your swab for BV/yeast is negative, and all STD testing is negative.  Great news!

## 2023-06-09 NOTE — Telephone Encounter (Signed)
Vitamin D has been sent in Pt has reviewed labs via MyChart

## 2023-07-08 ENCOUNTER — Ambulatory Visit: Payer: PRIVATE HEALTH INSURANCE | Admitting: Obstetrics & Gynecology

## 2023-07-08 VITALS — BP 130/86 | HR 88 | Ht 59.0 in | Wt 122.0 lb

## 2023-07-08 DIAGNOSIS — Z30011 Encounter for initial prescription of contraceptive pills: Secondary | ICD-10-CM | POA: Diagnosis not present

## 2023-07-08 DIAGNOSIS — N921 Excessive and frequent menstruation with irregular cycle: Secondary | ICD-10-CM | POA: Diagnosis not present

## 2023-07-08 DIAGNOSIS — Z3046 Encounter for surveillance of implantable subdermal contraceptive: Secondary | ICD-10-CM | POA: Diagnosis not present

## 2023-07-08 MED ORDER — NORETHIN ACE-ETH ESTRAD-FE 1-20 MG-MCG(24) PO TABS
1.0000 | ORAL_TABLET | Freq: Every day | ORAL | 5 refills | Status: DC
Start: 2023-07-08 — End: 2023-11-12

## 2023-07-08 NOTE — Progress Notes (Signed)
    GYNECOLOGY PROGRESS NOTE  Subjective:    Patient ID: Carolyn Cummings, female    DOB: 1996/04/01, 28 y.o.   MRN: [id number]  HPI  Patient is a 28 y.o. G0 here to have her 27 year old Nexplanon removed because of continued vaginal spotting. She reports negative STI testing recently. She has been abstinent for about 4 months. She used OCPs in the past and would like to return to that method.  The following portions of the patient's history were reviewed and updated as appropriate: allergies, current medications, past family history, past medical history, past social history, past surgical history, and problem list.  Review of Systems Pertinent items are noted in HPI.   Objective:   Blood pressure 130/86, pulse 88, height 4\' 11"  (1.499 m), weight 122 lb (55.3 kg), last menstrual period 06/17/2023. Body mass index is 24.64 kg/m. Well nourished, well hydrated  female, no apparent distress She is ambulating and conversing normally. Consent was signed and time out was done. Her left arm was prepped with betadine after establishing the position of the Nexplanon. I sprayed Hurricaine spray on the area. The area was then infiltrated with 2 cc of 1% lidocaine. A small incision was made and the intact rod was easily removed and noted to be intact.  A steristrip was placed and her arm was noted to be hemostatic. It was bandaged.  She tolerated the procedure well.    Assessment:   1. Encounter for removal of subdermal contraceptive implant   2. Encounter for initial prescription of contraceptive pills      Plan:   1. Encounter for removal of subdermal contraceptive implant (Primary)   2. Encounter for initial prescription of contraceptive pills Lo estrin prescribed. Rec back up method x 1 week, rec condoms for STI prevention.

## 2023-07-08 NOTE — Progress Notes (Signed)
 Irreg menses. Bleeding today. Wants to switch to Ocs. Hx OC use but not sure which ones she wants.

## 2023-07-13 ENCOUNTER — Encounter: Payer: Self-pay | Admitting: Family Medicine

## 2023-07-13 DIAGNOSIS — R350 Frequency of micturition: Secondary | ICD-10-CM

## 2023-07-14 NOTE — Addendum Note (Signed)
 Addended by: Sheliah Hatch on: 07/14/2023 09:08 AM   Modules accepted: Orders

## 2023-08-05 ENCOUNTER — Other Ambulatory Visit: Payer: Self-pay | Admitting: Urology

## 2023-08-05 DIAGNOSIS — R102 Pelvic and perineal pain: Secondary | ICD-10-CM

## 2023-08-07 ENCOUNTER — Ambulatory Visit
Admission: RE | Admit: 2023-08-07 | Discharge: 2023-08-07 | Disposition: A | Discharge status: Home / Self Care | Payer: PRIVATE HEALTH INSURANCE | Source: Ambulatory Visit | Attending: Urology | Admitting: Urology

## 2023-08-07 DIAGNOSIS — R102 Pelvic and perineal pain: Secondary | ICD-10-CM

## 2023-08-13 ENCOUNTER — Encounter: Payer: Self-pay | Admitting: Family Medicine

## 2023-08-15 ENCOUNTER — Telehealth (INDEPENDENT_AMBULATORY_CARE_PROVIDER_SITE_OTHER): Payer: PRIVATE HEALTH INSURANCE | Admitting: Family Medicine

## 2023-08-15 ENCOUNTER — Encounter: Payer: Self-pay | Admitting: Family Medicine

## 2023-08-15 DIAGNOSIS — F419 Anxiety disorder, unspecified: Secondary | ICD-10-CM | POA: Diagnosis not present

## 2023-08-15 MED ORDER — ONDANSETRON HCL 4 MG PO TABS: 4.0000 mg | ORAL_TABLET | Freq: Three times a day (TID) | ORAL | 0 refills | Status: AC | PRN

## 2023-08-15 MED ORDER — FLUOXETINE HCL 10 MG PO CAPS
10.0000 mg | ORAL_CAPSULE | Freq: Every day | ORAL | 3 refills | Status: DC
Start: 2023-08-15 — End: 2023-09-15

## 2023-08-15 NOTE — Progress Notes (Signed)
   Virtual Visit via Video   I connected with patient on 08/15/23 at  8:40 AM EDT by a video enabled telemedicine application and verified that I am speaking with the correct person using two identifiers.  Location patient: Home Location provider: [facility], Office Persons participating in the virtual visit: Patient, Provider, CMA Archie Patten H)  I discussed the limitations of evaluation and management by telemedicine and the availability of in person appointments. The patient expressed understanding and agreed to proceed.  Subjective:   HPI:   Anxiety- recently dx'd w/ interstitial cystitis and this is very stressful for pt.  Pt is in the midst of moving for travel nursing.    ROS:   See pertinent positives and negatives per HPI.  Patient Active Problem List   Diagnosis Date Noted   Pap smear for cervical cancer screening 07/08/2018   Anxiety 03/27/2018   Acne vulgaris 05/07/2017   Oral contraceptive use 05/07/2017   Physical exam 04/23/2016    Social History   Tobacco Use   Smoking status: Never   Smokeless tobacco: Never  Substance Use Topics   Alcohol use: No    Current Outpatient Medications:    EPIDUO FORTE 0.3-2.5 % GEL, , Disp: , Rfl: 0   etonogestrel (NEXPLANON) 68 MG IMPL implant, 1 each by Subdermal route once., Disp: , Rfl:    metroNIDAZOLE (FLAGYL) 500 MG tablet, Take 1 tablet (500 mg total) by mouth 2 (two) times daily. (Patient not taking: Reported on 06/06/2023), Disp: 14 tablet, Rfl: 0   Norethindrone Acetate-Ethinyl Estrad-FE (LOESTRIN 24 FE) 1-20 MG-MCG(24) tablet, Take 1 tablet by mouth daily., Disp: 84 tablet, Rfl: 5   phenazopyridine (PYRIDIUM) 200 MG tablet, Take 200 mg by mouth 3 (three) times daily. (Patient not taking: Reported on 07/08/2023), Disp: , Rfl:    Vitamin D, Ergocalciferol, (DRISDOL) 1.25 MG (50000 UNIT) CAPS capsule, Take 1 capsule (50,000 Units total) by mouth every 7 (seven) days., Disp: 12 capsule, Rfl: 0  No Known  Allergies  Objective:   There were no vitals taken for this visit. AAOx3, NAD NCAT, EOMI No obvious CN deficits Coloring WNL Pt is able to speak clearly, coherently without shortness of breath or increased work of breathing.  Thought process is linear.  Mood is appropriate.   Assessment and Plan:   Anxiety- deteriorated.  Pt has never really been on medication but she is realizing the toll that stress is taking on her body- particularly her interstitial cystitis.  She is open to daily medication.  Not interested in anything sedating so will avoid benzos.  Will start low dose fluoxetine and monitor closely for improvement.  Since pt has hx of nausea, will also send Zofran in case.  Pt expressed understanding and is in agreement w/ plan.    Neena Rhymes, MD 08/15/2023

## 2023-09-09 ENCOUNTER — Telehealth: Payer: Self-pay

## 2023-09-09 ENCOUNTER — Other Ambulatory Visit: Payer: Self-pay

## 2023-09-09 NOTE — Telephone Encounter (Signed)
 Ok to [employer] and schedule lab visit

## 2023-09-09 NOTE — Telephone Encounter (Signed)
 Called to schedule and pt states she does not need this quantiferon at this time.

## 2023-09-09 NOTE — Telephone Encounter (Signed)
 Patient needing quantiferon TB test for employment and is questioning if Dr.Tabori can place this order this week?

## 2023-09-09 NOTE — Telephone Encounter (Signed)
 Copied from CRM [id number]. Topic: Clinical - Request for Lab/Test Order >> Sep 09, 2023  9:50 AM Allyne Areola wrote: Reason for CRM: Patient is calling to schedule a quantiferon tb test for pre-employment. Would Dr.Tabori be able to place the order. She needs it done no later then this week.

## 2023-09-09 NOTE — Telephone Encounter (Signed)
 This is 2nd message on same thing.  Ok for [employer] and to schedule pt for lab visit.  Please log as [id number] error for duplicate messages

## 2023-09-09 NOTE — Telephone Encounter (Signed)
 Copied from CRM [id number]. Topic: Clinical - Request for Lab/Test Order >> Sep 09, 2023  9:50 AM Allyne Areola wrote: Reason for CRM: Patient is calling to schedule a quantiferon tb test for pre-employment. Would Dr.Tabori be able to place the order. She needs it done no later then this week. >> Sep 09, 2023 10:27 AM Kita Perish H wrote: Patient called to state that she doesn't need TB test done, her employer will be sending her to have it done.

## 2023-09-09 NOTE — Telephone Encounter (Signed)
 Okay to order pre employment screening for TB? Was last seen via virtual for Anxiety 08/15/2023

## 2023-09-10 ENCOUNTER — Telehealth: Payer: Self-pay

## 2023-09-10 ENCOUNTER — Telehealth: Payer: Self-pay | Admitting: Family Medicine

## 2023-09-10 NOTE — Telephone Encounter (Signed)
 Patient dropped off document  Physical statement form , to be filled out by provider. Patient requested to send it back via Call Patient to pick up within ASAP. Document is located in providers tray at front office.Please advise at Mobile [phone] (mobile)

## 2023-09-10 NOTE — Telephone Encounter (Signed)
 Sent to Dr.Tabori

## 2023-09-10 NOTE — Telephone Encounter (Signed)
 See last phone note Pt does not need lab drawn

## 2023-09-11 NOTE — Telephone Encounter (Signed)
 Placed in file cabinet front office Pt has been notified

## 2023-09-11 NOTE — Telephone Encounter (Signed)
 Form completed and returned to [place]

## 2023-09-15 ENCOUNTER — Other Ambulatory Visit: Payer: Self-pay

## 2023-09-15 MED ORDER — FLUOXETINE HCL 20 MG PO CAPS: 20.0000 mg | ORAL_CAPSULE | Freq: Every day | ORAL | 3 refills | Status: DC

## 2023-09-15 NOTE — Telephone Encounter (Signed)
 Prescription increased to 20mg  daily and sent to pharmacy

## 2023-09-15 NOTE — Telephone Encounter (Signed)
 Patient notes she has been taking Prozac  10 mg about a month now and is requesting an increase, please advise.  Prozac  10 mg pended

## 2023-09-15 NOTE — Telephone Encounter (Signed)
 Copied from CRM [id number]. Topic: Clinical - Medication Question >> Sep 15, 2023  8:23 AM [place] E wrote: Reason for CRM: Patient stated she was started on FLUoxetine  (PROZAC ) 10 MG capsule about a month ago and questioning if she is able to increase her dosage to either 20 mg or 30 mg. Callback number for patient is [phone].

## 2023-09-15 NOTE — Telephone Encounter (Signed)
 Patient is requesting to increase dose from Prozac  10 mg to Prozac  20 mg (thought there may be a phone note but could not locate one)   Please advise

## 2023-09-22 ENCOUNTER — Other Ambulatory Visit: Payer: Self-pay

## 2023-09-22 ENCOUNTER — Ambulatory Visit: Payer: PRIVATE HEALTH INSURANCE

## 2023-09-22 ENCOUNTER — Other Ambulatory Visit: Payer: PRIVATE HEALTH INSURANCE

## 2023-09-22 DIAGNOSIS — Z111 Encounter for screening for respiratory tuberculosis: Secondary | ICD-10-CM

## 2023-09-22 NOTE — Progress Notes (Signed)
 Patient went to [facility] for TB test instead of [facility].

## 2023-09-24 ENCOUNTER — Ambulatory Visit: Payer: Self-pay | Admitting: Family Medicine

## 2023-09-24 LAB — QUANTIFERON-TB GOLD PLUS
Mitogen-NIL: 6.24 [IU]/mL
NIL: 0.09 [IU]/mL
QuantiFERON-TB Gold Plus: NEGATIVE
TB1-NIL: <0 [IU]/mL
TB2-NIL: 0.02 [IU]/mL

## 2023-10-16 ENCOUNTER — Other Ambulatory Visit: Payer: Self-pay | Admitting: Family Medicine

## 2023-10-16 MED ORDER — FLUOXETINE HCL 20 MG PO CAPS: 20.0000 mg | ORAL_CAPSULE | Freq: Every day | ORAL | 3 refills | Status: DC

## 2023-10-16 NOTE — Telephone Encounter (Unsigned)
 Copied from CRM [id number]. Topic: Clinical - Medication Refill >> Oct 16, 2023  9:34 AM Juleen Oakland F wrote: Medication: FLUoxetine  (PROZAC ) 20 MG capsule [[id number]]  Has the patient contacted their pharmacy? Yes (Agent: If no, request that the patient contact the pharmacy for the refill. If patient does not wish to contact the pharmacy document the reason why and proceed with request.) (Agent: If yes, when and what did the pharmacy advise?)  This is the patient's preferred pharmacy:   [id number] - [place], [place] - [address] AT [facility] [address] [place] [address] Phone: [phone] Fax: [phone]  Is this the correct pharmacy for this prescription? Yes If no, delete pharmacy and type the correct one.   Has the prescription been filled recently? Yes  Is the patient out of the medication? Yes  Has the patient been seen for an appointment in the last year OR does the patient have an upcoming appointment? Yes  Can we respond through MyChart? Yes  Agent: Please be advised that Rx refills may take up to 3 business days. We ask that you follow-up with your pharmacy.

## 2023-10-16 NOTE — Telephone Encounter (Signed)
 Last Fill: 09/15/23 pt out of town and needs meds sent to alternate pharmacy    Routing to provider for review/authorization.

## 2023-11-10 ENCOUNTER — Encounter: Payer: Self-pay | Admitting: Family Medicine

## 2023-11-12 MED ORDER — NORETHINDRONE ACET-ETHINYL EST 1.5-30 MG-MCG PO TABS: 1.0000 | ORAL_TABLET | Freq: Every day | ORAL | 11 refills | Status: AC

## 2023-11-12 NOTE — Addendum Note (Signed)
 Addended by: Jasiah Elsen E on: 11/12/2023 04:14 PM   Modules accepted: Orders

## 2023-11-13 ENCOUNTER — Encounter: Payer: Self-pay | Admitting: Family Medicine

## 2024-01-14 ENCOUNTER — Other Ambulatory Visit: Payer: Self-pay | Admitting: Medical Genetics

## 2024-01-15 ENCOUNTER — Other Ambulatory Visit (HOSPITAL_COMMUNITY)
Admission: RE | Admit: 2024-01-15 | Discharge: 2024-01-15 | Disposition: A | Discharge status: Home / Self Care | Source: Ambulatory Visit | Attending: Family Medicine | Admitting: Family Medicine

## 2024-01-15 ENCOUNTER — Ambulatory Visit: Payer: Self-pay | Admitting: Family Medicine

## 2024-01-15 ENCOUNTER — Encounter: Payer: Self-pay | Admitting: Family Medicine

## 2024-01-15 VITALS — BP 118/72 | HR 78 | Temp 98.8°F | Wt 119.8 lb

## 2024-01-15 DIAGNOSIS — Z202 Contact with and (suspected) exposure to infections with a predominantly sexual mode of transmission: Secondary | ICD-10-CM | POA: Insufficient documentation

## 2024-01-15 DIAGNOSIS — F419 Anxiety disorder, unspecified: Secondary | ICD-10-CM

## 2024-01-15 DIAGNOSIS — R0982 Postnasal drip: Secondary | ICD-10-CM | POA: Diagnosis not present

## 2024-01-15 MED ORDER — FLUOXETINE HCL 40 MG PO CAPS: 40.0000 mg | ORAL_CAPSULE | Freq: Every day | ORAL | 3 refills | Status: AC

## 2024-01-15 MED ORDER — CETIRIZINE HCL 10 MG PO TABS: 10.0000 mg | ORAL_TABLET | Freq: Every day | ORAL | 11 refills | Status: AC

## 2024-01-15 MED ORDER — MOMETASONE FUROATE 50 MCG/ACT NA SUSP: 2.0000 | Freq: Every day | NASAL | 12 refills | Status: AC

## 2024-01-15 NOTE — Progress Notes (Signed)
   Subjective:    Patient ID: Carolyn Cummings, female    DOB: Jun 06, 1995, 28 y.o.   MRN: [id number]  HPI Anxiety- ongoing issue.  Currently on Fluoxetine  20mg  daily.  Pt reports medication helps control her interstitial cystitis.  Recently has had increased flares due to increased stress.  Interested in increasing dose.  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  Reports air is really dry.  Finds she is having to constantly clear her throat.  Was taking Loratadine daily w/o relief.  Some improvement w/ hot tea.  STD testing- no specific sxs or concerns, would like baseline screen   Review of Systems For ROS see HPI     Objective:   Physical Exam Vitals reviewed.  Constitutional:      General: She is not in acute distress.    Appearance: Normal appearance. She is well-developed.  HENT:     Head: Normocephalic and atraumatic.     Right Ear: Tympanic membrane normal.     Left Ear: Tympanic membrane normal.     Nose: Mucosal edema and congestion present. No rhinorrhea.     Right Sinus: No maxillary sinus tenderness or frontal sinus tenderness.     Left Sinus: No maxillary sinus tenderness or frontal sinus tenderness.     Mouth/Throat:     Pharynx: Posterior oropharyngeal erythema (w/ PND) present.  Eyes:     Conjunctiva/sclera: Conjunctivae normal.     Pupils: Pupils are equal, round, and reactive to light.  Cardiovascular:     Rate and Rhythm: Normal rate and regular rhythm.  Pulmonary:     Effort: Pulmonary effort is normal. No respiratory distress.  Musculoskeletal:     Cervical back: Normal range of motion and neck supple.  Lymphadenopathy:     Cervical: No cervical adenopathy.  Skin:    General: Skin is warm and dry.  Neurological:     General: No focal deficit present.     Mental Status: She is alert and oriented to person, place, and time.  Psychiatric:        Mood and Affect: Mood normal.        Behavior: Behavior normal.        Thought Content:  Thought content normal.           Assessment & Plan:  Possible exposure to STD- new.  Pt w/o sxs or specific concerns but would like baseline screening.  PND- new.  Pt w/ considerable turbinate edema, congestion, and PND.  Switch from Loratadine to more potent Cetirizine  and add Nasonex .  Pt expressed understanding and is in agreement w/ plan.

## 2024-01-15 NOTE — Assessment & Plan Note (Signed)
 Overall much better since starting Fluoxetine  but has recently had increased stress and subsequently more IC flares.  Will increase fluoxetine  to 40mg  daily and continue to monitor.  Pt expressed understanding and is in agreement w/ plan.

## 2024-01-15 NOTE — Patient Instructions (Addendum)
 Follow up as needed or as scheduled We'll notify you of your lab results and make any changes if needed INCREASE the Fluoxetine  to 40mg  daily- 2 of what you have at home and 1 of the new prescription START the Cetirizine  daily until the first frost ADD the Nasonex  on days that the congestion and drainage are really bad Drink LOTS of water Call with any questions or concerns Stay Safe!  Stay Healthy! Happy Fall!!!

## 2024-01-16 ENCOUNTER — Other Ambulatory Visit (HOSPITAL_COMMUNITY)
Admission: RE | Admit: 2024-01-16 | Discharge: 2024-01-16 | Disposition: A | Discharge status: Home / Self Care | Payer: Self-pay | Source: Ambulatory Visit | Attending: Medical Genetics | Admitting: Medical Genetics

## 2024-01-16 ENCOUNTER — Ambulatory Visit: Payer: Self-pay | Admitting: Family Medicine

## 2024-01-16 LAB — CERVICOVAGINAL ANCILLARY ONLY
Bacterial Vaginitis (gardnerella): NEGATIVE
Candida Glabrata: NEGATIVE
Candida Vaginitis: NEGATIVE
Chlamydia: NEGATIVE
Comment: NEGATIVE
Comment: NEGATIVE
Comment: NEGATIVE
Comment: NEGATIVE
Comment: NEGATIVE
Comment: NORMAL
Neisseria Gonorrhea: NEGATIVE
Trichomonas: NEGATIVE

## 2024-01-16 LAB — HIV ANTIBODY (ROUTINE TESTING W REFLEX)
HIV 1&2 Ab, 4th Generation: NONREACTIVE
HIV FINAL INTERPRETATION: NEGATIVE

## 2024-01-16 LAB — HEPATITIS C ANTIBODY: Hepatitis C Ab: NONREACTIVE

## 2024-01-16 LAB — HSV 2 ANTIBODY, IGG: HSV 2 Glycoprotein G Ab, IgG: <0.9 {index}

## 2024-01-16 LAB — RPR: RPR Ser Ql: NONREACTIVE

## 2024-01-16 LAB — HEPATITIS B SURFACE ANTIGEN: Hepatitis B Surface Ag: NONREACTIVE

## 2024-01-16 NOTE — Progress Notes (Signed)
 Patient reviewed labs and Dr. Charis message

## 2024-01-28 LAB — GENECONNECT MOLECULAR SCREEN: Genetic Analysis Overall Interpretation: NEGATIVE

## 2024-02-19 ENCOUNTER — Telehealth: Payer: Self-pay

## 2024-02-19 NOTE — Telephone Encounter (Signed)
 LVM- to call office If this is a lab bill from [facility] . He will need to contact [facility].  Please advise pt

## 2024-02-19 NOTE — Telephone Encounter (Signed)
 Copied from CRM [id number]. Topic: General - Billing Inquiry >> Feb 19, 2024 10:26 AM Robinson H wrote: Reason for CRM: Patients dad is calling to get clarity on some charges for his daughter, states he spoke with billing and was directed to speak with office. States he received a bill for date of service 01/15/2024 for 2 lab charges one from [facility] and one from [facility] and wants some clarity.  [place] [phone]

## 2024-02-19 NOTE — Telephone Encounter (Signed)
**Note De-identified  [place] Obfuscation** Please advise 

## 2024-02-19 NOTE — Telephone Encounter (Signed)
 Patient notes a rather large bill came from [facility] for Cytology run on 01/15/2024 DOS notes [facility] seemed to be reasonable but would like [place] bill reviewed, waiting on bill to be received via Email

## 2024-02-20 NOTE — Telephone Encounter (Signed)
 Have you guys received her bill yet?

## 2024-02-20 NOTE — Telephone Encounter (Signed)
 After reviewing the document this looks to be an estimated cost notification this is not a final bill and on review of charges none are duplicates and all are appropriately charged.  If he wants more information on why they cost the amount they do then would need to reach out to insurance.

## 2024-02-25 NOTE — Telephone Encounter (Signed)
 Patient father has been informed

## 2024-03-25 ENCOUNTER — Other Ambulatory Visit (HOSPITAL_COMMUNITY)
